# Patient Record
Sex: Male | Born: 1975
Health system: Southern US, Community
[De-identification: ages and names within clinical notes are randomized; demographics above are authoritative.]

## PROBLEM LIST (undated history)

## (undated) DIAGNOSIS — E7800 Pure hypercholesterolemia, unspecified: Secondary | ICD-10-CM

## (undated) DIAGNOSIS — I10 Essential (primary) hypertension: Secondary | ICD-10-CM

---

## 2014-04-06 ENCOUNTER — Other Ambulatory Visit: Payer: Self-pay | Admitting: Family Medicine

## 2014-04-06 ENCOUNTER — Ambulatory Visit
Admission: RE | Admit: 2014-04-06 | Discharge: 2014-04-06 | Disposition: A | Discharge status: Home / Self Care | Payer: 59 | Source: Ambulatory Visit | Attending: Family Medicine | Admitting: Family Medicine

## 2014-04-06 DIAGNOSIS — S8991XA Unspecified injury of right lower leg, initial encounter: Secondary | ICD-10-CM

## 2015-08-17 ENCOUNTER — Encounter (HOSPITAL_COMMUNITY): Payer: Self-pay | Admitting: Emergency Medicine

## 2015-08-17 ENCOUNTER — Emergency Department (HOSPITAL_COMMUNITY)
Admission: EM | Admit: 2015-08-17 | Discharge: 2015-08-17 | Disposition: A | Discharge status: Home / Self Care | Payer: No Typology Code available for payment source | Source: Home / Self Care | Attending: Emergency Medicine | Admitting: Emergency Medicine

## 2015-08-17 DIAGNOSIS — G5603 Carpal tunnel syndrome, bilateral upper limbs: Secondary | ICD-10-CM

## 2015-08-17 LAB — POCT I-STAT, CHEM 8
BUN: 16 mg/dL (ref 6–20)
Calcium, Ion: 1.24 mmol/L — ABNORMAL HIGH (ref 1.12–1.23)
Chloride: 104 mmol/L (ref 101–111)
Creatinine, Ser: 1.2 mg/dL (ref 0.61–1.24)
GLUCOSE: 100 mg/dL — AB (ref 65–99)
HEMATOCRIT: 49 % (ref 39.0–52.0)
HEMOGLOBIN: 16.7 g/dL (ref 13.0–17.0)
POTASSIUM: 3.7 mmol/L (ref 3.5–5.1)
Sodium: 141 mmol/L (ref 135–145)
TCO2: 27 mmol/L (ref 0–100)

## 2015-08-17 MED ORDER — METHYLPREDNISOLONE ACETATE 80 MG/ML IJ SUSP
80.0000 mg | Freq: Once | INTRAMUSCULAR | Status: AC
  Administered 2015-08-17: 80 mg via INTRAMUSCULAR

## 2015-08-17 MED ORDER — MELOXICAM 15 MG PO TABS: 15.0000 mg | ORAL_TABLET | Freq: Every day | ORAL | Status: DC

## 2015-08-17 MED ORDER — METHYLPREDNISOLONE ACETATE 80 MG/ML IJ SUSP
INTRAMUSCULAR | Status: AC
  Filled 2015-08-17: qty 1

## 2015-08-17 NOTE — ED Provider Notes (Signed)
CSN: [id number]     Arrival date & time 08/17/15  1411 History   First MD Initiated Contact with Patient 08/17/15 1552     Chief Complaint  Patient presents with  . Arm Problem   (Consider location/radiation/quality/duration/timing/severity/associated sxs/prior Treatment) HPI He is a 39 year old man here for evaluation of hand numbness. He states for the last 4-5 days he has had spasms and numbness in his bilateral hands. He states all of the fingers are numb. It is primarily the distal part of the fingers. He denies any weakness. His girlfriend told him his hands seem a little swollen. She also told him that his fingers twitch at night. No injury or trauma. 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 He has not tried any medications.  History reviewed. No pertinent past medical history. History reviewed. No pertinent past surgical history. No family history on file. Social History  Substance Use Topics  . Smoking status: Never Smoker   . Smokeless tobacco: None  . Alcohol Use: No    Review of Systems As in history of present illness Allergies  Review of patient's allergies indicates no known allergies.  Home Medications   Prior to Admission medications   Medication Sig Start Date End Date Taking? Authorizing Provider  meloxicam (MOBIC) 15 MG tablet Take 1 tablet (15 mg total) by mouth daily. 08/17/15   Melony Overly, MD   Meds Ordered and Administered this Visit   Medications  methylPREDNISolone acetate (DEPO-MEDROL) injection 80 mg (not administered)    BP 144/92 mmHg  Pulse 71  Temp(Src) 98.6 F (37 C) (Oral)  Resp 16  SpO2 98% No data found.   Physical Exam  Constitutional: He is oriented to person, place, and time. He appears well-developed and well-nourished. No distress.  Cardiovascular: Normal rate.   Pulmonary/Chest: Effort normal.  Musculoskeletal:  Hands: Possible very mild swelling. He has full active range of motion. 5 out of 5 strength in grip  and interosseous. Normal thumb opposition. 2+ radial pulses bilaterally. Brisk cap refill in all digits. Positive Tinel's and Phalen's bilaterally. Sensation to light touch grossly intact.  Neurological: He is alert and oriented to person, place, and time.    ED Course  Procedures (including critical care time)  Labs Review Labs Reviewed  POCT I-STAT, CHEM 8 - Abnormal; Notable for the following:    Glucose, Bld 100 (*)    Calcium, Ion 1.24 (*)    All other components within normal limits    Imaging Review No results found.    MDM   1. Carpal tunnel syndrome, bilateral    Bilateral wrist splints given. Meloxicam daily for the next 2 weeks. Recommended icing, particularly after work. Follow-up with hand specialist if needed.    Melony Overly, MD 08/17/15 [phone]

## 2015-08-17 NOTE — Discharge Instructions (Signed)
Carpal Tunnel Syndrome Carpal tunnel syndrome is a condition that causes pain in your hand and arm. The carpal tunnel is a narrow area that is on the palm side of your wrist. Repeated wrist motion or certain diseases may cause swelling in the tunnel. This swelling can pinch the main nerve in the wrist (median nerve).  HOME CARE If You Have a Splint:  Wear it as told by your doctor. Wear it as much as you can for the next 2 weeks, included nighttime.  Loosen the splint if your fingers:  Become numb and tingle.  Turn blue and cold.  Keep the splint clean and dry. General Instructions  Take over-the-counter and prescription medicines only as told by your doctor.  Take meloxicam daily for 2 weeks.  Rest your wrist from any activity that may be causing your pain. If needed, talk to your employer about changes that can be made in your work, such as getting a wrist pad to use while typing.  Apply ice to the painful area:  Put ice in a plastic bag.  Place a towel between your skin and the bag.  Leave the ice on for 20 minutes, 2-3 times per [place].  Keep all follow-up visits as told by your doctor. This is important.  Do any exercises as told by your doctor, physical therapist, or occupational therapist. GET HELP IF:  You have new symptoms.  Medicine does not help your pain.  Your symptoms get worse.   This information is not intended to replace advice given to you by your health care provider. Make sure you discuss any questions you have with your health care provider.   Document Released: 10/04/2011 Document Revised: 07/06/2015 Document Reviewed: 03/02/2015 Elsevier Interactive Patient Education [employer].

## 2016-02-05 ENCOUNTER — Observation Stay (HOSPITAL_COMMUNITY): Payer: Managed Care, Other (non HMO)

## 2016-02-05 ENCOUNTER — Observation Stay (HOSPITAL_COMMUNITY)
Admission: EM | Admit: 2016-02-05 | Discharge: 2016-02-06 | Disposition: A | Discharge status: Home / Self Care | Payer: Managed Care, Other (non HMO) | Attending: Oncology | Admitting: Oncology

## 2016-02-05 ENCOUNTER — Encounter (HOSPITAL_COMMUNITY): Payer: Self-pay | Admitting: *Deleted

## 2016-02-05 ENCOUNTER — Encounter (HOSPITAL_COMMUNITY): Payer: Self-pay | Admitting: Radiology

## 2016-02-05 ENCOUNTER — Ambulatory Visit (INDEPENDENT_AMBULATORY_CARE_PROVIDER_SITE_OTHER)
Admission: EM | Admit: 2016-02-05 | Discharge: 2016-02-05 | Disposition: A | Discharge status: Home / Self Care | Payer: Managed Care, Other (non HMO) | Source: Home / Self Care | Attending: Family Medicine | Admitting: Family Medicine

## 2016-02-05 ENCOUNTER — Emergency Department (HOSPITAL_COMMUNITY): Payer: Managed Care, Other (non HMO)

## 2016-02-05 DIAGNOSIS — R29818 Other symptoms and signs involving the nervous system: Secondary | ICD-10-CM | POA: Diagnosis not present

## 2016-02-05 DIAGNOSIS — I639 Cerebral infarction, unspecified: Secondary | ICD-10-CM

## 2016-02-05 DIAGNOSIS — E785 Hyperlipidemia, unspecified: Secondary | ICD-10-CM | POA: Diagnosis not present

## 2016-02-05 DIAGNOSIS — R4781 Slurred speech: Secondary | ICD-10-CM | POA: Diagnosis not present

## 2016-02-05 DIAGNOSIS — G5600 Carpal tunnel syndrome, unspecified upper limb: Secondary | ICD-10-CM | POA: Diagnosis not present

## 2016-02-05 DIAGNOSIS — R202 Paresthesia of skin: Secondary | ICD-10-CM | POA: Diagnosis not present

## 2016-02-05 DIAGNOSIS — R471 Dysarthria and anarthria: Secondary | ICD-10-CM | POA: Diagnosis not present

## 2016-02-05 DIAGNOSIS — R200 Anesthesia of skin: Principal | ICD-10-CM

## 2016-02-05 DIAGNOSIS — G459 Transient cerebral ischemic attack, unspecified: Secondary | ICD-10-CM

## 2016-02-05 HISTORY — DX: Pure hypercholesterolemia, unspecified: E78.00

## 2016-02-05 HISTORY — DX: Essential (primary) hypertension: I10

## 2016-02-05 LAB — I-STAT CHEM 8, ED
BUN: 17 mg/dL (ref 6–20)
CALCIUM ION: 1.17 mmol/L (ref 1.12–1.23)
Chloride: 100 mmol/L — ABNORMAL LOW (ref 101–111)
Creatinine, Ser: 1 mg/dL (ref 0.61–1.24)
GLUCOSE: 97 mg/dL (ref 65–99)
HCT: 50 % (ref 39.0–52.0)
Hemoglobin: 17 g/dL (ref 13.0–17.0)
Potassium: 3.4 mmol/L — ABNORMAL LOW (ref 3.5–5.1)
SODIUM: 141 mmol/L (ref 135–145)
TCO2: 27 mmol/L (ref 0–100)

## 2016-02-05 LAB — DIFFERENTIAL
Basophils Absolute: 0 10*3/uL (ref 0.0–0.1)
Basophils Relative: 0 %
EOS PCT: 2 %
Eosinophils Absolute: 0.2 10*3/uL (ref 0.0–0.7)
LYMPHS ABS: 1.9 10*3/uL (ref 0.7–4.0)
LYMPHS PCT: 29 %
MONO ABS: 0.6 10*3/uL (ref 0.1–1.0)
Monocytes Relative: 9 %
NEUTROS ABS: 3.9 10*3/uL (ref 1.7–7.7)
Neutrophils Relative %: 60 %

## 2016-02-05 LAB — COMPREHENSIVE METABOLIC PANEL
ALK PHOS: 57 U/L (ref 38–126)
ALT: 25 U/L (ref 17–63)
ANION GAP: 11 (ref 5–15)
AST: 27 U/L (ref 15–41)
Albumin: 3.8 g/dL (ref 3.5–5.0)
BILIRUBIN TOTAL: 0.9 mg/dL (ref 0.3–1.2)
BUN: 13 mg/dL (ref 6–20)
CALCIUM: 9.5 mg/dL (ref 8.9–10.3)
CO2: 25 mmol/L (ref 22–32)
Chloride: 103 mmol/L (ref 101–111)
Creatinine, Ser: 1.14 mg/dL (ref 0.61–1.24)
GFR calc Af Amer: >60 mL/min (ref >60)
GFR calc non Af Amer: >60 mL/min (ref >60)
GLUCOSE: 97 mg/dL (ref 65–99)
Potassium: 3.5 mmol/L (ref 3.5–5.1)
Sodium: 139 mmol/L (ref 135–145)
TOTAL PROTEIN: 7.8 g/dL (ref 6.5–8.1)

## 2016-02-05 LAB — CBC
HEMATOCRIT: 45.3 % (ref 39.0–52.0)
HEMOGLOBIN: 14.8 g/dL (ref 13.0–17.0)
MCH: 27.5 pg (ref 26.0–34.0)
MCHC: 32.7 g/dL (ref 30.0–36.0)
MCV: 84.2 fL (ref 78.0–100.0)
Platelets: 225 10*3/uL (ref 150–400)
RBC: 5.38 MIL/uL (ref 4.22–5.81)
RDW: 12.6 % (ref 11.5–15.5)
WBC: 6.6 10*3/uL (ref 4.0–10.5)

## 2016-02-05 LAB — RAPID URINE DRUG SCREEN, HOSP PERFORMED
Amphetamines: NOT DETECTED
BARBITURATES: NOT DETECTED
Benzodiazepines: NOT DETECTED
Cocaine: NOT DETECTED
OPIATES: NOT DETECTED
TETRAHYDROCANNABINOL: NOT DETECTED

## 2016-02-05 LAB — ETHANOL: Alcohol, Ethyl (B): <5 mg/dL (ref <5)

## 2016-02-05 LAB — URINALYSIS, ROUTINE W REFLEX MICROSCOPIC
Bilirubin Urine: NEGATIVE
Glucose, UA: NEGATIVE mg/dL
Hgb urine dipstick: NEGATIVE
KETONES UR: NEGATIVE mg/dL
LEUKOCYTES UA: NEGATIVE
NITRITE: NEGATIVE
PROTEIN: NEGATIVE mg/dL
Specific Gravity, Urine: >1.046 — ABNORMAL HIGH (ref 1.005–1.030)
pH: 6.5 (ref 5.0–8.0)

## 2016-02-05 LAB — I-STAT TROPONIN, ED: TROPONIN I, POC: 0 ng/mL (ref 0.00–0.08)

## 2016-02-05 LAB — PROTIME-INR
INR: 0.99 (ref 0.00–1.49)
Prothrombin Time: 13.3 seconds (ref 11.6–15.2)

## 2016-02-05 LAB — APTT: aPTT: 30 seconds (ref 24–37)

## 2016-02-05 MED ORDER — STROKE: EARLY STAGES OF RECOVERY BOOK
Freq: Once | Status: AC
  Administered 2016-02-06
  Filled 2016-02-05: qty 1

## 2016-02-05 MED ORDER — ACETAMINOPHEN 650 MG RE SUPP: 650.0000 mg | RECTAL | Status: DC | PRN

## 2016-02-05 MED ORDER — ACETAMINOPHEN 325 MG PO TABS: 650.0000 mg | ORAL_TABLET | ORAL | Status: DC | PRN

## 2016-02-05 MED ORDER — IOPAMIDOL (ISOVUE-370) INJECTION 76%
INTRAVENOUS | Status: AC
  Filled 2016-02-05: qty 50

## 2016-02-05 MED ORDER — ENOXAPARIN SODIUM 40 MG/0.4ML ~~LOC~~ SOLN
40.0000 mg | SUBCUTANEOUS | Status: DC
  Administered 2016-02-06: 40 mg via SUBCUTANEOUS
  Filled 2016-02-05: qty 0.4

## 2016-02-05 MED ORDER — SENNOSIDES-DOCUSATE SODIUM 8.6-50 MG PO TABS
1.0000 | ORAL_TABLET | Freq: Every evening | ORAL | Status: DC | PRN
  Filled 2016-02-05: qty 1

## 2016-02-05 MED ORDER — POTASSIUM CHLORIDE CRYS ER 10 MEQ PO TBCR
10.0000 meq | EXTENDED_RELEASE_TABLET | Freq: Once | ORAL | Status: AC
Start: 2016-02-05 — End: 2016-02-06
  Administered 2016-02-06: 10 meq via ORAL
  Filled 2016-02-05: qty 1

## 2016-02-05 MED ORDER — ASPIRIN 81 MG PO CHEW
81.0000 mg | CHEWABLE_TABLET | Freq: Every day | ORAL | Status: DC
  Administered 2016-02-05 – 2016-02-06 (×2): 81 mg via ORAL
  Filled 2016-02-05 (×2): qty 1

## 2016-02-05 MED ORDER — IOPAMIDOL (ISOVUE-370) INJECTION 76%
50.0000 mL | Freq: Once | INTRAVENOUS | Status: AC | PRN
  Administered 2016-02-05: 50 mL via INTRAVENOUS

## 2016-02-05 NOTE — ED Notes (Signed)
Patient states he went to bed about 1500 and when he woke up 1830 he started having some bilateral arm weakness. Patient alert and oriented x4 on arrival . Patient moving all extermties. Patient denise chest pain. Patient complains of jaw pain. MD and Neurologist at bedside,

## 2016-02-05 NOTE — ED Provider Notes (Signed)
CSN: [id number]     Arrival date & time 02/05/16  1946 History   First MD Initiated Contact with Patient 02/05/16 2006     Chief Complaint  Patient presents with  . Code Stroke    An emergency department physician performed an initial assessment on this suspected stroke patient at 62. (Consider location/radiation/quality/duration/timing/severity/associated sxs/prior Treatment) HPI Comments: Patient is a 40 year old male presenting from urgent care due to numbness and slurred speech. Patient states that at 3:30 he went to bed to take a nap and when he woke up at 6:30 he had bilateral upper extremity numbness, neck pain and slurred speech. Patient denies ever having symptoms like this. He denies any headache, chest pain or shortness of breath.  He denies any recent infectious symptoms or colds. He does continue to complain of some numbness in the left arm. He denies any swallowing difficulty, aphasia, lower extremity complaints. No visual complaints. No alcohol, drug or tobacco abuse. Patient states he is supposed be on a cholesterol medication but he does not take it.  The history is provided by the patient.    Past Medical History  Diagnosis Date  . Hypertension   . High cholesterol    No past surgical history on file. No family history on file. Social History  Substance Use Topics  . Smoking status: Never Smoker   . Smokeless tobacco: None  . Alcohol Use: No    Review of Systems  All other systems reviewed and are negative.     Allergies  Other  Home Medications   Prior to Admission medications   Medication Sig Start Date End Date Taking? Authorizing Provider  meloxicam (MOBIC) 15 MG tablet Take 1 tablet (15 mg total) by mouth daily. 08/17/15   Melony Overly, MD   BP 125/80 mmHg  Pulse 82  Temp(Src) 98.6 F (37 C)  Resp 13  SpO2 98% Physical Exam  Constitutional: He is oriented to person, place, and time. He appears well-developed and well-nourished. No distress.   HENT:  Head: Normocephalic and atraumatic.  Mouth/Throat: Oropharynx is clear and moist.  Eyes: Conjunctivae and EOM are normal. Pupils are equal, round, and reactive to light.  Neck: Normal range of motion. Neck supple.  Cardiovascular: Normal rate, regular rhythm and intact distal pulses.   No murmur heard. Pulmonary/Chest: Effort normal and breath sounds normal. No respiratory distress. He has no wheezes. He has no rales.  Abdominal: Soft. He exhibits no distension. There is no tenderness. There is no rebound and no guarding.  Musculoskeletal: Normal range of motion. He exhibits no edema or tenderness.  Neurological: He is alert and oriented to person, place, and time. He has normal strength. A sensory deficit is present. No cranial nerve deficit. Coordination normal.  Decreased sensation in the left upper extremity.  Speech is slow and purposeful.  No aphasia or slurred speech  Skin: Skin is warm and dry. No rash noted. No erythema.  Psychiatric: He has a normal mood and affect. His behavior is normal.  Nursing note and vitals reviewed.   ED Course  Procedures (including critical care time) Labs Review Labs Reviewed  I-STAT CHEM 8, ED - Abnormal; Notable for the following:    Potassium 3.4 (*)    Chloride 100 (*)    All other components within normal limits  ETHANOL  PROTIME-INR  APTT  CBC  DIFFERENTIAL  COMPREHENSIVE METABOLIC PANEL  URINE RAPID DRUG SCREEN, HOSP PERFORMED  URINALYSIS, ROUTINE W REFLEX MICROSCOPIC (NOT AT [facility])  I-STAT TROPOININ, ED    Imaging Review Ct Angio Head W/cm &/or Wo Cm  02/05/2016  CLINICAL DATA:  Weakness and numbness of the upper extremities. EXAM: CT ANGIOGRAPHY HEAD AND NECK TECHNIQUE: Multidetector CT imaging of the head and neck was performed using the standard protocol during bolus administration of intravenous contrast. Multiplanar CT image reconstructions and MIPs were obtained to evaluate the vascular anatomy. Carotid stenosis  measurements (when applicable) are obtained utilizing NASCET criteria, using the distal internal carotid diameter as the denominator. CONTRAST:  50 cc Isovue 370 COMPARISON:  Head CT earlier same [place] FINDINGS: CTA NECK Aortic arch: Aortic arch appears normal. No atherosclerotic change, aneurysm or dissection. Branching pattern of the brachiocephalic vessels from the arch is normal. Right carotid system: Common carotid artery widely patent to the bifurcation. Normal appearing bifurcation. Cervical internal carotid artery is normal. Left carotid system: Common carotid artery widely patent to the bifurcation. Carotid bifurcation is normal. Cervical internal carotid artery is normal. Vertebral arteries:Left vertebral artery dominant. Both vertebral artery origins appear normal. Both vertebral arteries normal through the cervical region. Skeleton: Normal Other neck: Normal CTA HEAD Anterior circulation: Both internal carotid arteries widely patent through the siphon regions. The anterior and middle cerebral vessels are normal without proximal stenosis, aneurysm or vascular malformation. Posterior circulation: Both vertebral arteries are patent to the basilar. No basilar stenosis. Posterior circulation branch vessels are normal. Venous sinuses: Patent and normal Anatomic variants: None significant Delayed phase: No abnormal enhancement IMPRESSION: Normal CT angiography of the neck and the head. Electronically Signed   By: Nelson Chimes M.D.   On: 02/05/2016 20:37   Ct Head Wo Contrast  02/05/2016  CLINICAL DATA:  Bilateral upper extremity numbness. EXAM: CT HEAD WITHOUT CONTRAST TECHNIQUE: Contiguous axial images were obtained from the base of the skull through the vertex without intravenous contrast. COMPARISON:  02/05/2016 FINDINGS: There is no evidence of mass effect, midline shift or extra-axial fluid collections. There is no evidence of a space-occupying lesion or intracranial hemorrhage. There is no evidence of a  cortical-based area of acute infarction. The ventricles and sulci are appropriate for the patient's age. The basal cisterns are patent. Visualized portions of the orbits are unremarkable. The visualized portions of the paranasal sinuses and mastoid air cells are unremarkable. The osseous structures are unremarkable. IMPRESSION: No acute intracranial pathology. These results were called by telephone at the time of interpretation on 02/05/2016 at 8:16 pm to Dr. Epifania Gore, who verbally acknowledged these results. Electronically Signed   By: Kathreen Devoid   On: 02/05/2016 20:18   Ct Angio Neck W/cm &/or Wo/cm  02/05/2016  CLINICAL DATA:  Weakness and numbness of the upper extremities. EXAM: CT ANGIOGRAPHY HEAD AND NECK TECHNIQUE: Multidetector CT imaging of the head and neck was performed using the standard protocol during bolus administration of intravenous contrast. Multiplanar CT image reconstructions and MIPs were obtained to evaluate the vascular anatomy. Carotid stenosis measurements (when applicable) are obtained utilizing NASCET criteria, using the distal internal carotid diameter as the denominator. CONTRAST:  50 cc Isovue 370 COMPARISON:  Head CT earlier same [place] FINDINGS: CTA NECK Aortic arch: Aortic arch appears normal. No atherosclerotic change, aneurysm or dissection. Branching pattern of the brachiocephalic vessels from the arch is normal. Right carotid system: Common carotid artery widely patent to the bifurcation. Normal appearing bifurcation. Cervical internal carotid artery is normal. Left carotid system: Common carotid artery widely patent to the bifurcation. Carotid bifurcation is normal. Cervical internal carotid artery is normal. Vertebral  arteries:Left vertebral artery dominant. Both vertebral artery origins appear normal. Both vertebral arteries normal through the cervical region. Skeleton: Normal Other neck: Normal CTA HEAD Anterior circulation: Both internal carotid arteries widely patent  through the siphon regions. The anterior and middle cerebral vessels are normal without proximal stenosis, aneurysm or vascular malformation. Posterior circulation: Both vertebral arteries are patent to the basilar. No basilar stenosis. Posterior circulation branch vessels are normal. Venous sinuses: Patent and normal Anatomic variants: None significant Delayed phase: No abnormal enhancement IMPRESSION: Normal CT angiography of the neck and the head. Electronically Signed   By: Nelson Chimes M.D.   On: 02/05/2016 20:37   I have personally reviewed and evaluated these images and lab results as part of my medical decision-making.   EKG Interpretation   Date/Time:  Sunday February 05 2016 20:26:48 EDT Ventricular Rate:  82 PR Interval:  162 QRS Duration: 117 QT Interval:  363 QTC Calculation: 424 R Axis:   39 Text Interpretation:  Sinus rhythm Nonspecific intraventricular conduction  delay ST elev, probable normal early repol pattern No previous tracing  Confirmed by Maryan Rued  MD, Loree Fee ([id number]) on 02/05/2016 8:37:36 PM      MDM   Final diagnoses:  CVA (cerebral infarction)  CVA (cerebral infarction)    Patient is a 40 year old male with a history of hyperlipidemia in the past and he states he's supposed to be on medication for it may be hypertension presented to urgent care with stroke type symptoms. Patient states that he went to bed at 3:30 for a nap and was normal when he woke up at 6:30 he had bilateral upper extremity numbness and weakness as well as limits. Currently he is only complaining of numbness in the left arm as well as neck and head pain. He has never experienced symptoms like this before. He denies drug or alcohol use. No tobacco abuse. Stroke and cardiac disease run in the family. Patient seen by the stroke team and currently he is not a candidate for TPA. He will be getting a CTA of the head, neck and an MRI.  Dr. Nicole Kindred with neurology does feel that the patient needs to be  admitted for stroke workup. He is currently an NIH of 1 for numbness in the left upper extremity.  EKG with early repolarization but no other acute findings. Labs within normal limits expected for a mild hypokalemic of 3.4. Head CT and CTA of the head and neck are within normal limits. Will admit for further care.    Blanchie Dessert, MD 02/05/16 2057

## 2016-02-05 NOTE — Consult Note (Signed)
Admission H&P    Chief Complaint: Upper extremity numbness and slurred speech.  HPI: Charles Shaffer is an 40 y.o. male with a history of hyperlipidemia as well as possible hypertension with poor compliance with treatment, presenting with new onset numbness involving initially left upper extremity and subsequently involving his right upper extremity, along with slurred speech. Patient was last known well at 3:30 PM. He took a nap and symptoms were present when he woke up at 6:30 PM. He has no previous history of stroke nor TIA. He has not been on antiplatelet therapy daily. CT scan of his head was unremarkable with no acute findings. CT angiogram of head and neck was also unremarkable. Patient's deficits resolved except for mild residual numbness involving his left upper extremity.  LSN: 3:30 PM on 02/05/2016 tPA Given: No: Minimal, largely subjective deficits mRankin:  Past Medical History  Diagnosis Date  . Hypertension   . High cholesterol     No past surgical history on file.  No family history on file. Social History:  reports that he has never smoked. He does not have any smokeless tobacco history on file. He reports that he does not drink alcohol or use illicit drugs.  Allergies:  Allergies  Allergen Reactions  . Cashew Nut Oil Other (See Comments)    Numbness on tongue and mouth    Medications: Patient's preadmission medications were reviewed by me.  ROS: History obtained from the patient  General ROS: negative for - chills, fatigue, fever, night sweats, weight gain or weight loss Psychological ROS: negative for - behavioral disorder, hallucinations, memory difficulties, mood swings or suicidal ideation Ophthalmic ROS: negative for - blurry vision, double vision, eye pain or loss of vision ENT ROS: negative for - epistaxis, nasal discharge, oral lesions, sore throat, tinnitus or vertigo Allergy and Immunology ROS: negative for - hives or itchy/watery eyes Hematological and  Lymphatic ROS: negative for - bleeding problems, bruising or swollen lymph nodes Endocrine ROS: negative for - galactorrhea, hair pattern changes, polydipsia/polyuria or temperature intolerance Respiratory ROS: negative for - cough, hemoptysis, shortness of breath or wheezing Cardiovascular ROS: negative for - chest pain, dyspnea on exertion, edema or irregular heartbeat Gastrointestinal ROS: negative for - abdominal pain, diarrhea, hematemesis, nausea/vomiting or stool incontinence Genito-Urinary ROS: negative for - dysuria, hematuria, incontinence or urinary frequency/urgency Musculoskeletal ROS: negative for - joint swelling or muscular weakness Neurological ROS: as noted in HPI Dermatological ROS: negative for rash and skin lesion changes  Physical Examination: Blood pressure 127/85, pulse 69, temperature 98.6 F (37 C), resp. rate 12, SpO2 98 %.  HEENT-  Normocephalic, no lesions, without obvious abnormality.  Normal external eye and conjunctiva.  Normal TM's bilaterally.  Normal auditory canals and external ears. Normal external nose, mucus membranes and septum.  Normal pharynx. Neck supple with no masses, nodes, nodules or enlargement. Cardiovascular - regular rate and rhythm, S1, S2 normal, no murmur, click, rub or gallop Lungs - chest clear, no wheezing, rales, normal symmetric air entry Abdomen - soft, non-tender; bowel sounds normal; no masses,  no organomegaly Extremities - no joint deformities, effusion, or inflammation and no edema  Neurologic Examination: Mental Status: Alert, oriented, thought content appropriate.  Speech fluent without evidence of aphasia. Able to follow commands without difficulty. Cranial Nerves: II-Visual fields were normal. III/IV/VI-Pupils were equal and reacted. Extraocular movements were full and conjugate.    V/VII-no facial numbness and no facial weakness. VIII-normal. X-normal speech and symmetrical palatal movement. XI: trapezius  strength/neck flexion strength normal  bilaterally XII-midline tongue extension with normal strength. Motor: 5/5 bilaterally with normal tone and bulk Sensory: Reduced perception of tactile sensation involving left upper extremity compared to right upper extremity. Deep Tendon Reflexes: 1+ and symmetric. Plantars: Flexor bilaterally Cerebellar: Normal finger-to-nose testing. Carotid auscultation: Normal  Results for orders placed or performed during the hospital encounter of 02/05/16 (from the past 48 hour(s))  Ethanol     Status: None   Collection Time: 02/05/16  8:20 PM  Result Value Ref Range   Alcohol, Ethyl (B) <5 <5 mg/dL    Comment:        LOWEST DETECTABLE LIMIT FOR SERUM ALCOHOL IS 5 mg/dL FOR MEDICAL PURPOSES ONLY   I-stat troponin, ED (not at [facility], [facility])     Status: None   Collection Time: 02/05/16  8:20 PM  Result Value Ref Range   Troponin i, poc 0.00 0.00 - 0.08 ng/mL   Comment 3            Comment: Due to the release kinetics of cTnI, a negative result within the first hours of the onset of symptoms does not rule out myocardial infarction with certainty. If myocardial infarction is still suspected, repeat the test at appropriate intervals.   Protime-INR     Status: None   Collection Time: 02/05/16  8:21 PM  Result Value Ref Range   Prothrombin Time 13.3 11.6 - 15.2 seconds   INR 0.99 0.00 - 1.49  APTT     Status: None   Collection Time: 02/05/16  8:21 PM  Result Value Ref Range   aPTT 30 24 - 37 seconds  CBC     Status: None   Collection Time: 02/05/16  8:21 PM  Result Value Ref Range   WBC 6.6 4.0 - 10.5 K/uL   RBC 5.38 4.22 - 5.81 MIL/uL   Hemoglobin 14.8 13.0 - 17.0 g/dL   HCT 45.3 39.0 - 52.0 %   MCV 84.2 78.0 - 100.0 fL   MCH 27.5 26.0 - 34.0 pg   MCHC 32.7 30.0 - 36.0 g/dL   RDW 12.6 11.5 - 15.5 %   Platelets 225 150 - 400 K/uL  Differential     Status: None   Collection Time: 02/05/16  8:21 PM  Result Value Ref Range   Neutrophils Relative %  60 %   Neutro Abs 3.9 1.7 - 7.7 K/uL   Lymphocytes Relative 29 %   Lymphs Abs 1.9 0.7 - 4.0 K/uL   Monocytes Relative 9 %   Monocytes Absolute 0.6 0.1 - 1.0 K/uL   Eosinophils Relative 2 %   Eosinophils Absolute 0.2 0.0 - 0.7 K/uL   Basophils Relative 0 %   Basophils Absolute 0.0 0.0 - 0.1 K/uL  Comprehensive metabolic panel     Status: None   Collection Time: 02/05/16  8:21 PM  Result Value Ref Range   Sodium 139 135 - 145 mmol/L   Potassium 3.5 3.5 - 5.1 mmol/L   Chloride 103 101 - 111 mmol/L   CO2 25 22 - 32 mmol/L   Glucose, Bld 97 65 - 99 mg/dL   BUN 13 6 - 20 mg/dL   Creatinine, Ser 1.14 0.61 - 1.24 mg/dL   Calcium 9.5 8.9 - 10.3 mg/dL   Total Protein 7.8 6.5 - 8.1 g/dL   Albumin 3.8 3.5 - 5.0 g/dL   AST 27 15 - 41 U/L   ALT 25 17 - 63 U/L   Alkaline Phosphatase 57 38 - 126 U/L  Total Bilirubin 0.9 0.3 - 1.2 mg/dL   GFR calc non Af Amer >60 >60 mL/min   GFR calc Af Amer >60 >60 mL/min    Comment: (NOTE) The eGFR has been calculated using the CKD EPI equation. This calculation has not been validated in all clinical situations. eGFR's persistently <60 mL/min signify possible Chronic Kidney Disease.    Anion gap 11 5 - 15  I-Stat Chem 8, ED  (not at [facility], [facility])     Status: Abnormal   Collection Time: 02/05/16  8:22 PM  Result Value Ref Range   Sodium 141 135 - 145 mmol/L   Potassium 3.4 (L) 3.5 - 5.1 mmol/L   Chloride 100 (L) 101 - 111 mmol/L   BUN 17 6 - 20 mg/dL   Creatinine, Ser 1.00 0.61 - 1.24 mg/dL   Glucose, Bld 97 65 - 99 mg/dL   Calcium, Ion 1.17 1.12 - 1.23 mmol/L   TCO2 27 0 - 100 mmol/L   Hemoglobin 17.0 13.0 - 17.0 g/dL   HCT 50.0 39.0 - 52.0 %   Ct Angio Head W/cm &/or Wo Cm  02/05/2016  CLINICAL DATA:  Weakness and numbness of the upper extremities. EXAM: CT ANGIOGRAPHY HEAD AND NECK TECHNIQUE: Multidetector CT imaging of the head and neck was performed using the standard protocol during bolus administration of intravenous contrast. Multiplanar CT  image reconstructions and MIPs were obtained to evaluate the vascular anatomy. Carotid stenosis measurements (when applicable) are obtained utilizing NASCET criteria, using the distal internal carotid diameter as the denominator. CONTRAST:  50 cc Isovue 370 COMPARISON:  Head CT earlier same [place] FINDINGS: CTA NECK Aortic arch: Aortic arch appears normal. No atherosclerotic change, aneurysm or dissection. Branching pattern of the brachiocephalic vessels from the arch is normal. Right carotid system: Common carotid artery widely patent to the bifurcation. Normal appearing bifurcation. Cervical internal carotid artery is normal. Left carotid system: Common carotid artery widely patent to the bifurcation. Carotid bifurcation is normal. Cervical internal carotid artery is normal. Vertebral arteries:Left vertebral artery dominant. Both vertebral artery origins appear normal. Both vertebral arteries normal through the cervical region. Skeleton: Normal Other neck: Normal CTA HEAD Anterior circulation: Both internal carotid arteries widely patent through the siphon regions. The anterior and middle cerebral vessels are normal without proximal stenosis, aneurysm or vascular malformation. Posterior circulation: Both vertebral arteries are patent to the basilar. No basilar stenosis. Posterior circulation branch vessels are normal. Venous sinuses: Patent and normal Anatomic variants: None significant Delayed phase: No abnormal enhancement IMPRESSION: Normal CT angiography of the neck and the head. Electronically Signed   By: Nelson Chimes M.D.   On: 02/05/2016 20:37   Ct Head Wo Contrast  02/05/2016  CLINICAL DATA:  Bilateral upper extremity numbness. EXAM: CT HEAD WITHOUT CONTRAST TECHNIQUE: Contiguous axial images were obtained from the base of the skull through the vertex without intravenous contrast. COMPARISON:  02/05/2016 FINDINGS: There is no evidence of mass effect, midline shift or extra-axial fluid collections. There is  no evidence of a space-occupying lesion or intracranial hemorrhage. There is no evidence of a cortical-based area of acute infarction. The ventricles and sulci are appropriate for the patient's age. The basal cisterns are patent. Visualized portions of the orbits are unremarkable. The visualized portions of the paranasal sinuses and mastoid air cells are unremarkable. The osseous structures are unremarkable. IMPRESSION: No acute intracranial pathology. These results were called by telephone at the time of interpretation on 02/05/2016 at 8:16 pm to Dr. Epifania Gore, who verbally  acknowledged these results. Electronically Signed   By: Kathreen Devoid   On: 02/05/2016 20:18   Ct Angio Neck W/cm &/or Wo/cm  02/05/2016  CLINICAL DATA:  Weakness and numbness of the upper extremities. EXAM: CT ANGIOGRAPHY HEAD AND NECK TECHNIQUE: Multidetector CT imaging of the head and neck was performed using the standard protocol during bolus administration of intravenous contrast. Multiplanar CT image reconstructions and MIPs were obtained to evaluate the vascular anatomy. Carotid stenosis measurements (when applicable) are obtained utilizing NASCET criteria, using the distal internal carotid diameter as the denominator. CONTRAST:  50 cc Isovue 370 COMPARISON:  Head CT earlier same [place] FINDINGS: CTA NECK Aortic arch: Aortic arch appears normal. No atherosclerotic change, aneurysm or dissection. Branching pattern of the brachiocephalic vessels from the arch is normal. Right carotid system: Common carotid artery widely patent to the bifurcation. Normal appearing bifurcation. Cervical internal carotid artery is normal. Left carotid system: Common carotid artery widely patent to the bifurcation. Carotid bifurcation is normal. Cervical internal carotid artery is normal. Vertebral arteries:Left vertebral artery dominant. Both vertebral artery origins appear normal. Both vertebral arteries normal through the cervical region. Skeleton: Normal Other  neck: Normal CTA HEAD Anterior circulation: Both internal carotid arteries widely patent through the siphon regions. The anterior and middle cerebral vessels are normal without proximal stenosis, aneurysm or vascular malformation. Posterior circulation: Both vertebral arteries are patent to the basilar. No basilar stenosis. Posterior circulation branch vessels are normal. Venous sinuses: Patent and normal Anatomic variants: None significant Delayed phase: No abnormal enhancement IMPRESSION: Normal CT angiography of the neck and the head. Electronically Signed   By: Nelson Chimes M.D.   On: 02/05/2016 20:37    Assessment: 40 y.o. male with hyperlipidemia and equivocal history of hypertension presenting with possible TIA. Small vessel out, as well.  Stroke Risk Factors - hyperlipidemia  Plan: 1. HgbA1c, fasting lipid panel 2. MRI, MRA  of the brain without contrast 3. PT consult, OT consult, Speech consult 4. Echocardiogram 5. Carotid dopplers 6. Prophylactic therapy-Antiplatelet med: Aspirin  7. Risk factor modification 8. Telemetry monitoring 9. Hypercoagulopathy panel  C.R. Nicole Kindred, MD [facility] [phone]  02/05/2016, 9:27 PM

## 2016-02-05 NOTE — ED Notes (Signed)
Per pt: woke up from nap approx 30 min ago with BUE numbness and slight pain in back of neck along with slurred speech.  In talking with pt, speech is not slurred, but is quiet and barely audible.  Pt keeping eyes closed while talking.  Bilat hand grasps equal and strong, though slow to get to full strength.  Able to maintain raised arms equally.  Per father: states pt called him reporting BUE numbness, sweating, and was talking with slurred speech.  Father states pt ambulating without any difficulty.  Upon returning to triage rm, pt's eyes opened looking at phone.

## 2016-02-05 NOTE — ED Notes (Signed)
Patient still in MRI.  

## 2016-02-05 NOTE — Progress Notes (Signed)
E.R called  After patient assigined to Probation officer by charge and obtained report. Informed that patient currently in MRI

## 2016-02-05 NOTE — ED Notes (Signed)
Report called to Angela Nevin, ED Triage RN.

## 2016-02-05 NOTE — H&P (Signed)
Date: 02/05/2016               Patient Name:  Charles Shaffer MRN: [id number]  DOB: [birth year] Age / Sex: 40 y.o., male   PCP: No Pcp Per Patient         Medical Service: Internal Medicine Teaching Service         Attending Physician: Dr. Annia Belt, MD    First Contact: Dr. Loleta Chance Pager: [phone]  Second Contact: Dr. Charlott Rakes Pager: [phone]       After Hours (After 5p/  First Contact Pager: [phone]  weekends / holidays): Second Contact Pager: [phone]   Chief Complaint: Bilateral upper extremity numbness and slurred speech  History of Present Illness: Charles Shaffer is a 40yo with carpal tunnel syndrome, hyperlipidemia and possible HTN who presents with bilateral upper extremity numbness and slurred speech since 6:30pm today. He felt his normal self when taking a nap at 3:30pm, but woke up at 6:30pm with numbness in both his arms and going across his chest, as well as slurred speech. Both of these symptoms have gradually improved, with some residual LUE sx but are now resolved. He has never had these symptoms before. He says he experiences hand weakness with his carpal tunnel, and these current symptoms are totally different. He does say he had a recent URI ~2 weeks ago. He also noticed a pruritic rash on his left back over the past week, as well as a 'cyst' on his right shoulder, and a little muscle tightness on the left side of his posterior neck. Otherwise, he denies trauma, fevers, headache, vision changes, lightheadedness, vertigo/tinnitus, facial droop, any pain, any weakness, chest pain, shortness of breath, N/V/D, urinary symptoms, sick contacts, tick bites, recent camping adventures or other travel. He is a never smoker, denies EtOH abuse, or any recreational/IVDU.  Meds: Current Facility-Administered Medications  Medication Dose Route Frequency Provider Last Rate Last Dose  .  stroke: mapping our early stages of recovery book   Does not apply Once Milagros Loll, MD        . acetaminophen (TYLENOL) tablet 650 mg  650 mg Oral Q4H PRN Milagros Loll, MD       Or  . acetaminophen (TYLENOL) suppository 650 mg  650 mg Rectal Q4H PRN Milagros Loll, MD      . aspirin chewable tablet 81 mg  81 mg Oral Daily Milagros Loll, MD   81 mg at 02/05/16 2210  . senna-docusate (Senokot-S) tablet 1 tablet  1 tablet Oral QHS PRN Milagros Loll, MD       Current Outpatient Prescriptions  Medication Sig Dispense Refill  . acetaminophen (TYLENOL) 500 MG tablet Take 1,000 mg by mouth every 6 (six) hours as needed (pain).    . Cholecalciferol (VITAMIN D PO) Take 1 tablet by mouth daily.    . Cyanocobalamin (VITAMIN B-12 PO) Take 1 tablet by mouth daily.    . Multiple Vitamins-Minerals (ZINC PO) Take 1 tablet by mouth daily.    . Omega-3 Fatty Acids (FISH OIL PO) Take 1 capsule by mouth daily.    . SELENIUM PO Take 1 tablet by mouth daily.    . meloxicam (MOBIC) 15 MG tablet Take 1 tablet (15 mg total) by mouth daily. (Patient not taking: Reported on 02/05/2016) 30 tablet 1    Allergies: Allergies as of 02/05/2016 - Review Complete 02/05/2016  Allergen Reaction Noted  . Cashew nut oil Other (See Comments) 02/05/2016  Past Medical History  Diagnosis Date  . Hypertension   . High cholesterol    No past surgical history on file. Family History  Problem Relation Age of Onset  . Stroke Brother 37  . Diabetes Father 65  . CAD Father 85   Social History   Social History  . Marital Status: Married    Spouse Name: N/A  . Number of Children: N/A  . Years of Education: N/A   Occupational History  . Not on file.   Social History Main Topics  . Smoking status: Never Smoker   . Smokeless tobacco: Never Used  . Alcohol Use: No  . Drug Use: No  . Sexual Activity: Not on file   Other Topics Concern  . Not on file   Social History Narrative   Review of Systems: Pertinent items noted in HPI and remainder of comprehensive ROS otherwise negative.  Physical  Exam: Blood pressure 117/76, pulse 67, temperature 98.6 F (37 C), resp. rate 12, SpO2 98 %.   Gen: Well-appearing, alert and oriented to person, place, and time HEENT: Oropharynx clear without erythema or exudate.  Neck: No cervical LAD, no thyromegaly or nodules, no JVD noted. No carotid bruits heard. CV: Normal rate, regular rhythm, no murmurs, rubs, or gallops Pulmonary: Normal effort, CTA bilaterally, no crackles or wheezes Abdominal: Soft, non-tender, non-distended, without rebound, guarding, or masses Extremities: Distal pulses 2+ in upper and lower extremities bilaterally, no tenderness, erythema or edema Neuro: CN II-XII grossly intact, 5/5 strength in upper and lower extremities symmetrically. No decrease in fine touch in bilateral hands or rest of upper/lower extremities. No finger-to-nose or heel-to-shin dysmetria. No dysdiadochokinesia. Spurling's test negative bilaterally. Lhermitte's sign negative. No vertebral point tenderness. Normal ROM in upper extremities bilaterally.  Skin: No atypical appearing moles. There is a small area of confluent erythema, non-vesicular, non-morbiliform over the left flank. Small ecchymosis over the right posterior shoulder.   Lab results: Basic Metabolic Panel:  Recent Labs  02/05/16 2021 02/05/16 2022  NA 139 141  K 3.5 3.4*  CL 103 100*  CO2 25  --   GLUCOSE 97 97  BUN 13 17  CREATININE 1.14 1.00  CALCIUM 9.5  --    Liver Function Tests:  Recent Labs  02/05/16 2021  AST 27  ALT 25  ALKPHOS 57  BILITOT 0.9  PROT 7.8  ALBUMIN 3.8   CBC:  Recent Labs  02/05/16 2021 02/05/16 2022  WBC 6.6  --   NEUTROABS 3.9  --   HGB 14.8 17.0  HCT 45.3 50.0  MCV 84.2  --   PLT 225  --    Coagulation:  Recent Labs  02/05/16 2021  LABPROT 13.3  INR 0.99   Imaging results:  Ct Angio Head W/cm &/or Wo Cm  02/05/2016  CLINICAL DATA:  Weakness and numbness of the upper extremities. EXAM: CT ANGIOGRAPHY HEAD AND NECK TECHNIQUE:  Multidetector CT imaging of the head and neck was performed using the standard protocol during bolus administration of intravenous contrast. Multiplanar CT image reconstructions and MIPs were obtained to evaluate the vascular anatomy. Carotid stenosis measurements (when applicable) are obtained utilizing NASCET criteria, using the distal internal carotid diameter as the denominator. CONTRAST:  50 cc Isovue 370 COMPARISON:  Head CT earlier same [place] FINDINGS: CTA NECK Aortic arch: Aortic arch appears normal. No atherosclerotic change, aneurysm or dissection. Branching pattern of the brachiocephalic vessels from the arch is normal. Right carotid system: Common carotid artery widely patent to  the bifurcation. Normal appearing bifurcation. Cervical internal carotid artery is normal. Left carotid system: Common carotid artery widely patent to the bifurcation. Carotid bifurcation is normal. Cervical internal carotid artery is normal. Vertebral arteries:Left vertebral artery dominant. Both vertebral artery origins appear normal. Both vertebral arteries normal through the cervical region. Skeleton: Normal Other neck: Normal CTA HEAD Anterior circulation: Both internal carotid arteries widely patent through the siphon regions. The anterior and middle cerebral vessels are normal without proximal stenosis, aneurysm or vascular malformation. Posterior circulation: Both vertebral arteries are patent to the basilar. No basilar stenosis. Posterior circulation branch vessels are normal. Venous sinuses: Patent and normal Anatomic variants: None significant Delayed phase: No abnormal enhancement IMPRESSION: Normal CT angiography of the neck and the head. Electronically Signed   By: Nelson Chimes M.D.   On: 02/05/2016 20:37   Ct Head Wo Contrast  02/05/2016  CLINICAL DATA:  Bilateral upper extremity numbness. EXAM: CT HEAD WITHOUT CONTRAST TECHNIQUE: Contiguous axial images were obtained from the base of the skull through the vertex  without intravenous contrast. COMPARISON:  02/05/2016 FINDINGS: There is no evidence of mass effect, midline shift or extra-axial fluid collections. There is no evidence of a space-occupying lesion or intracranial hemorrhage. There is no evidence of a cortical-based area of acute infarction. The ventricles and sulci are appropriate for the patient's age. The basal cisterns are patent. Visualized portions of the orbits are unremarkable. The visualized portions of the paranasal sinuses and mastoid air cells are unremarkable. The osseous structures are unremarkable. IMPRESSION: No acute intracranial pathology. These results were called by telephone at the time of interpretation on 02/05/2016 at 8:16 pm to Dr. Epifania Gore, who verbally acknowledged these results. Electronically Signed   By: Kathreen Devoid   On: 02/05/2016 20:18   Ct Angio Neck W/cm &/or Wo/cm  02/05/2016  CLINICAL DATA:  Weakness and numbness of the upper extremities. EXAM: CT ANGIOGRAPHY HEAD AND NECK TECHNIQUE: Multidetector CT imaging of the head and neck was performed using the standard protocol during bolus administration of intravenous contrast. Multiplanar CT image reconstructions and MIPs were obtained to evaluate the vascular anatomy. Carotid stenosis measurements (when applicable) are obtained utilizing NASCET criteria, using the distal internal carotid diameter as the denominator. CONTRAST:  50 cc Isovue 370 COMPARISON:  Head CT earlier same [place] FINDINGS: CTA NECK Aortic arch: Aortic arch appears normal. No atherosclerotic change, aneurysm or dissection. Branching pattern of the brachiocephalic vessels from the arch is normal. Right carotid system: Common carotid artery widely patent to the bifurcation. Normal appearing bifurcation. Cervical internal carotid artery is normal. Left carotid system: Common carotid artery widely patent to the bifurcation. Carotid bifurcation is normal. Cervical internal carotid artery is normal. Vertebral  arteries:Left vertebral artery dominant. Both vertebral artery origins appear normal. Both vertebral arteries normal through the cervical region. Skeleton: Normal Other neck: Normal CTA HEAD Anterior circulation: Both internal carotid arteries widely patent through the siphon regions. The anterior and middle cerebral vessels are normal without proximal stenosis, aneurysm or vascular malformation. Posterior circulation: Both vertebral arteries are patent to the basilar. No basilar stenosis. Posterior circulation branch vessels are normal. Venous sinuses: Patent and normal Anatomic variants: None significant Delayed phase: No abnormal enhancement IMPRESSION: Normal CT angiography of the neck and the head. Electronically Signed   By: Nelson Chimes M.D.   On: 02/05/2016 20:37   Other results: EKG: normal EKG, normal sinus rhythm  Assessment & Plan by Problem: 1. Transient bilateral upper extremity numbness and slurred speech -  40yo with transient symptoms, with h/o HLD, possibly HTN (although normotensive on no meds here). So far, CT with angiography has been normal. He is at risk for a CVA/TIA given these factors, which is still our working diagnosis, but the bilateral upper extremity numbness and questionable slurred speech are an irregular distribution for a CVA and could represent a cervical spinal cord process as well, simply compressive/mechanical or, less likely, an autoimmune process such as MS. However, these are less likely given his lack of trauma, negative Spurling's and Lhermitte's bilaterally. He does have a vague history of viral URI, but a polyneuropathy related to that would more likely be a progressive lower extremity presentation. He has a pruritic rash that looks like an abrasion that erupted 1 week ago, which does not appear viral/VZV in nature and his numbness would present unilaterally in that case. He has no associated symptoms to suggest a complex migraine. Finally, his symptoms are not the  same quality or distribution as his carpal tunnel syndrome, so I do not suspect this is causing his symptoms. -Neurology following; appreciate the thoughtful care of this mutual patient -Stroke/TIA workup as above; MRI/MRA, lipid panel, A1c. CTA of head and neck normal so unsure if carotid dopplers necessary. Could consider an EMG if the stroke workup comes back unrevealing. -Anti-phospholipid, B2 microglobulin, cardiolipin -Repeat EKG in AM -Telemetry -Risk factor modification - consider ASA, statin  Dispo: Disposition is deferred at this time, awaiting improvement of current medical problems. Anticipated discharge in approximately 1-2 Lehner(s).   The patient does not have a current PCP (No Pcp Per Patient) and does need an [facility] hospital follow-up appointment after discharge.  The patient does not have transportation limitations that hinder transportation to clinic appointments.  Signed: Norval Gable, MD 02/05/2016, 10:30 PM

## 2016-02-05 NOTE — Code Documentation (Signed)
Charles Shaffer is a 39yo bm presenting to [facility] via pvt vehicle after waking up with Lt neck and head pain and numbness & tingling of his BUE.  NIH 1 for sensory impairment.  He states he is feeling better and the HA & tingling has improved though he states he still is feeling "not right".  CTA completed.

## 2016-02-05 NOTE — ED Provider Notes (Signed)
CSN: [id number]     Arrival date & time 02/05/16  1847 History   First MD Initiated Contact with Patient 02/05/16 1907     Chief Complaint  Patient presents with  . Numbness   (Consider location/radiation/quality/duration/timing/severity/associated sxs/prior Treatment) Patient is a 40 y.o. male presenting with neurologic complaint. The history is provided by the patient and a parent.  Neurologic Problem This is a new problem. The current episode started 1 to 2 hours ago (awoke from nap with bilat UE numb, tingling without weakness, no lower ext or gait problems). The problem has been gradually improving. Pertinent negatives include no chest pain, no abdominal pain and no headaches.    History reviewed. No pertinent past medical history. History reviewed. No pertinent past surgical history. No family history on file. Social History  Substance Use Topics  . Smoking status: Never Smoker   . Smokeless tobacco: None  . Alcohol Use: No    Review of Systems  Constitutional: Negative.   HENT: Negative.   Cardiovascular: Negative for chest pain.  Gastrointestinal: Negative for abdominal pain.  Neurological: Positive for speech difficulty and numbness. Negative for dizziness, facial asymmetry, weakness and headaches.  All other systems reviewed and are negative.   Allergies  Other  Home Medications   Prior to Admission medications   Medication Sig Start Date End Date Taking? Authorizing Provider  meloxicam (MOBIC) 15 MG tablet Take 1 tablet (15 mg total) by mouth daily. 08/17/15   Melony Overly, MD   Meds Ordered and Administered this Visit  Medications - No data to display  BP 139/88 mmHg  Pulse 87  Resp 16  SpO2 100% No data found.   Physical Exam  Constitutional: He is oriented to person, place, and time. He appears well-developed and well-nourished. He appears lethargic. No distress.  Eyes: Conjunctivae and EOM are normal. Pupils are equal, round, and reactive to light.   Neck: Normal range of motion. Neck supple.  Cardiovascular: Normal rate, regular rhythm, normal heart sounds and intact distal pulses.   Musculoskeletal: Normal range of motion. He exhibits no tenderness.  Lymphadenopathy:    He has no cervical adenopathy.  Neurological: He is oriented to person, place, and time. He has normal strength. He appears lethargic. He displays normal reflexes. No cranial nerve deficit or sensory deficit. Coordination and gait normal. GCS eye subscore is 4. GCS verbal subscore is 5. GCS motor subscore is 6.  Skin: Skin is warm and dry.  Nursing note and vitals reviewed.   ED Course  Procedures (including critical care time)  Labs Review Labs Reviewed - No data to display  Imaging Review No results found.   Visual Acuity Review  Right Eye Distance:   Left Eye Distance:   Bilateral Distance:    Right Eye Near:   Left Eye Near:    Bilateral Near:         MDM   1. Neurologic abnormality    Sent for eval of neuro disorder, ? etiol.    Billy Fischer, MD 02/05/16 [phone]

## 2016-02-05 NOTE — ED Notes (Signed)
CODE STROKE ACTIVATED @ 20:00 PM

## 2016-02-05 NOTE — ED Notes (Signed)
Attempted report 

## 2016-02-05 NOTE — ED Notes (Signed)
Patient provided urinal to provide urine sample.

## 2016-02-05 NOTE — Progress Notes (Signed)
Patient admitted to 5M11. Patient alert and oriented to unit and room tele applied and verified X2

## 2016-02-05 NOTE — ED Notes (Signed)
Called MRI to advised patient has bed and to call when ready to transport.

## 2016-02-06 ENCOUNTER — Encounter (HOSPITAL_COMMUNITY): Payer: Self-pay | Admitting: *Deleted

## 2016-02-06 ENCOUNTER — Telehealth: Payer: Self-pay | Admitting: Internal Medicine

## 2016-02-06 ENCOUNTER — Observation Stay (HOSPITAL_BASED_OUTPATIENT_CLINIC_OR_DEPARTMENT_OTHER): Payer: Managed Care, Other (non HMO)

## 2016-02-06 DIAGNOSIS — G459 Transient cerebral ischemic attack, unspecified: Secondary | ICD-10-CM | POA: Diagnosis not present

## 2016-02-06 DIAGNOSIS — R200 Anesthesia of skin: Principal | ICD-10-CM

## 2016-02-06 DIAGNOSIS — E785 Hyperlipidemia, unspecified: Secondary | ICD-10-CM | POA: Diagnosis not present

## 2016-02-06 DIAGNOSIS — R208 Other disturbances of skin sensation: Secondary | ICD-10-CM | POA: Diagnosis not present

## 2016-02-06 LAB — BASIC METABOLIC PANEL
Anion gap: 10 (ref 5–15)
BUN: 13 mg/dL (ref 6–20)
CALCIUM: 9.2 mg/dL (ref 8.9–10.3)
CHLORIDE: 101 mmol/L (ref 101–111)
CO2: 28 mmol/L (ref 22–32)
CREATININE: 1.1 mg/dL (ref 0.61–1.24)
GFR calc Af Amer: >60 mL/min (ref >60)
GFR calc non Af Amer: >60 mL/min (ref >60)
Glucose, Bld: 108 mg/dL — ABNORMAL HIGH (ref 65–99)
Potassium: 3.4 mmol/L — ABNORMAL LOW (ref 3.5–5.1)
SODIUM: 139 mmol/L (ref 135–145)

## 2016-02-06 LAB — LIPID PANEL
CHOL/HDL RATIO: 5.5 ratio
Cholesterol: 220 mg/dL — ABNORMAL HIGH (ref 0–200)
HDL: 40 mg/dL — AB (ref >40)
LDL CALC: 141 mg/dL — AB (ref 0–99)
Triglycerides: 194 mg/dL — ABNORMAL HIGH (ref <150)
VLDL: 39 mg/dL (ref 0–40)

## 2016-02-06 LAB — ECHOCARDIOGRAM COMPLETE
HEIGHTINCHES: 67 in
WEIGHTICAEL: 2872 [oz_av]

## 2016-02-06 LAB — HIV ANTIBODY (ROUTINE TESTING W REFLEX): HIV Screen 4th Generation wRfx: NONREACTIVE

## 2016-02-06 LAB — MAGNESIUM: Magnesium: 2 mg/dL (ref 1.7–2.4)

## 2016-02-06 MED ORDER — ASPIRIN 81 MG PO CHEW: 81.0000 mg | CHEWABLE_TABLET | Freq: Every day | ORAL | Status: DC

## 2016-02-06 MED ORDER — ATORVASTATIN CALCIUM 40 MG PO TABS: 40.0000 mg | ORAL_TABLET | Freq: Every day | ORAL | Status: DC

## 2016-02-06 MED ORDER — TRIAMCINOLONE ACETONIDE 0.1 % EX OINT: TOPICAL_OINTMENT | Freq: Two times a day (BID) | CUTANEOUS | Status: DC

## 2016-02-06 MED ORDER — TRIAMCINOLONE ACETONIDE 0.1 % EX OINT
TOPICAL_OINTMENT | Freq: Two times a day (BID) | CUTANEOUS | Status: DC
  Filled 2016-02-06: qty 15

## 2016-02-06 NOTE — Evaluation (Signed)
Physical Therapy Evaluation and Discharge Patient Details Name: Charles Shaffer MRN: [id number] DOB: Dec 28, 1975 Today's Date: 02/06/2016   History of Present Illness  Presented with bil UE numbness and slurred speech; MRI brain negative PMHx-hyperlipidenmia, HTN  Clinical Impression  Patient evaluated by Physical Therapy with no further acute PT needs identified. PT is signing off. Thank you for this referral.     Follow Up Recommendations No PT follow up    Equipment Recommendations  None recommended by PT    Recommendations for Other Services       Precautions / Restrictions Precautions Precautions: None      Mobility  Bed Mobility Overal bed mobility: Independent                Transfers Overall transfer level: Independent                  Ambulation/Gait Ambulation/Gait assistance: Independent Ambulation Distance (Feet): 200 Feet Assistive device: None Gait Pattern/deviations: WFL(Within Functional Limits) (slightly antalgic due to rt heel pain)   Gait velocity interpretation: at or above normal speed for age/gender    Stairs Stairs: Yes Stairs assistance: Independent Stair Management: No rails;Forwards;Alternating pattern Number of Stairs: 5 General stair comments: also able to step-touch alternately with each foot x 8 (per Merrilee Jansky)  Wheelchair Mobility    Modified Rankin (Stroke Patients Only) Modified Rankin (Stroke Patients Only) Pre-Morbid Rankin Score: No symptoms Modified Rankin: No symptoms     Balance Overall balance assessment: Independent                               Standardized Balance Assessment Standardized Balance Assessment : Dynamic Gait Index   Dynamic Gait Index Level Surface: Normal Change in Gait Speed: Normal Gait with Horizontal Head Turns: Normal Gait with Vertical Head Turns: Normal Gait and Pivot Turn: Normal Step Over Obstacle: Normal Step Around Obstacles: Normal Steps: Normal Total Score:  24       Pertinent Vitals/Pain Pain Assessment: Faces Faces Pain Scale: Hurts little more Pain Location: Rt heel Pain Descriptors / Indicators: Guarding;Sharp Pain Intervention(s): Limited activity within patient's tolerance    Home Living Family/patient expects to be discharged to:: Private residence Living Arrangements: Alone   Type of Home: House Home Access: Stairs to enter   Technical brewer of Steps: 1 Home Layout: One level Home Equipment: None      Prior Function Level of Independence: Independent         Comments: works as a Nurse, adult in Education officer, community        Extremity/Trunk Assessment   Upper Extremity Assessment: Overall WFL for tasks assessed           Lower Extremity Assessment: Overall WFL for tasks assessed      Cervical / Trunk Assessment: Normal  Communication   Communication: No difficulties  Cognition Arousal/Alertness: Awake/alert Behavior During Therapy: WFL for tasks assessed/performed Overall Cognitive Status: Within Functional Limits for tasks assessed                      General Comments      Exercises        Assessment/Plan    PT Assessment Patent does not need any further PT services  PT Diagnosis Abnormality of gait   PT Problem List    PT Treatment Interventions     PT Goals (Current goals can be found in  the Care Plan section) Acute Rehab PT Goals PT Goal Formulation: All assessment and education complete, DC therapy    Frequency     Barriers to discharge        Co-evaluation               End of Session   Activity Tolerance: Patient tolerated treatment well Patient left: Other (comment) (standing with Dr. Erlinda Hong and NP) Nurse Communication: Mobility status (signed off sheet to be up independently)    Functional Assessment Tool Used: clinical judgement Functional Limitation: Mobility: Walking and moving around Mobility: Walking and Moving Around Current  Status [phone]): 0 percent impaired, limited or restricted Mobility: Walking and Moving Around Goal Status [phone]): 0 percent impaired, limited or restricted Mobility: Walking and Moving Around Discharge Status [phone]): 0 percent impaired, limited or restricted    Time: [id number] PT Time Calculation (min) (ACUTE ONLY): 10 min   Charges:   PT Evaluation $PT Eval Low Complexity: 1 Procedure     PT G Codes:   PT G-Codes **NOT FOR INPATIENT CLASS** Functional Assessment Tool Used: clinical judgement Functional Limitation: Mobility: Walking and moving around Mobility: Walking and Moving Around Current Status [id number]): 0 percent impaired, limited or restricted Mobility: Walking and Moving Around Goal Status [id number]): 0 percent impaired, limited or restricted Mobility: Walking and Moving Around Discharge Status [id number]): 0 percent impaired, limited or restricted    Lukasz Rogus 02/06/2016, 11:30 AM Pager [phone]

## 2016-02-06 NOTE — Telephone Encounter (Signed)
Pt needs TOC scheduled for 02/27/16 with dr Melburn Hake

## 2016-02-06 NOTE — Progress Notes (Signed)
PT Cancellation Note  Patient Details Name: Charles Shaffer MRN: [id number] DOB: 11/29/1975   Cancelled Treatment:    Reason Eval/Treat Not Completed: Patient at procedure or test/unavailable. Pt off unit for echo. PT will see today as able   [name] 02/06/2016, 9:03 AM Pager [phone]

## 2016-02-06 NOTE — Progress Notes (Signed)
STROKE TEAM PROGRESS NOTE   HISTORY OF PRESENT ILLNESS Charles Shaffer is an 40 y.o. male with a history of hyperlipidemia as well as possible hypertension with poor compliance with treatment, presenting with new onset numbness involving initially left upper extremity and subsequently involving his right upper extremity, along with slurred speech. Patient was last known well at 3:30 PM on 02/05/2016. He took a nap and symptoms were present when he woke up at 6:30 PM. He has no previous history of stroke nor TIA. He has not been on antiplatelet therapy daily. CT scan of his head was unremarkable with no acute findings. CT angiogram of head and neck was also unremarkable. Patient's deficits resolved except for mild residual numbness involving his left upper extremity. Patient was not administered IV t-PA secondary to minimal, largely subjective deficits. He was admitted for further evaluation and treatment.   SUBJECTIVE (INTERVAL HISTORY) Patient up walking with therapy in the halls, no complaints. Overall he feels his condition is resolved. He would like to go home   OBJECTIVE Temp: [97.9 F (36.6 C)-98.8 F (37.1 C)] 97.9 F (36.6 C) (04/10 1014) Pulse Rate: [61-89] 73 (04/10 1014) Cardiac Rhythm: [-] Normal sinus rhythm;Bundle branch block (04/10 0700) Resp: [12-20] 20 (04/10 1014) BP: (115-139)/(60-93) 128/60 mmHg (04/10 1014) SpO2: [97 %-100 %] 99 % (04/10 1014) Weight: [81.421 kg (179 lb 8 oz)] 81.421 kg (179 lb 8 oz) (04/10 0000)  CBC:   Last Labs      Recent Labs Lab 02/05/16 2021 02/05/16 2022  WBC 6.6 --   NEUTROABS 3.9 --   HGB 14.8 17.0  HCT 45.3 50.0  MCV 84.2 --   PLT 225 --       Basic Metabolic Panel:   Last Labs      Recent Labs Lab 02/05/16 2021 02/05/16 2022 02/06/16 0002 02/06/16 0635  NA 139 141 --  139  K 3.5 3.4* --  3.4*  CL 103 100* --  101  CO2 25 --  --  28  GLUCOSE 97 97 --   108*  BUN 13 17 --  13  CREATININE 1.14 1.00 --  1.10  CALCIUM 9.5 --  --  9.2  MG --  --  2.0 --       Lipid Panel:   Labs (Brief)       Component Value Date/Time   CHOL 220* 02/06/2016 0635   TRIG 194* 02/06/2016 0635   HDL 40* 02/06/2016 0635   CHOLHDL 5.5 02/06/2016 0635   VLDL 39 02/06/2016 0635   LDLCALC 141* 02/06/2016 0635     HgbA1c:    Recent Labs    No results found for: HGBA1C   Urine Drug Screen:   Labs (Brief)       Component Value Date/Time   LABOPIA NONE DETECTED 02/05/2016 2203   COCAINSCRNUR NONE DETECTED 02/05/2016 2203   LABBENZ NONE DETECTED 02/05/2016 2203   AMPHETMU NONE DETECTED 02/05/2016 2203   THCU NONE DETECTED 02/05/2016 2203   LABBARB NONE DETECTED 02/05/2016 2203        IMAGING I have personally reviewed the radiological images below and agree with the radiology interpretations.  Ct Angio Head W/cm &/or Wo Cm 02/05/2016 Normal CT angiography of the head.   Ct Head Wo Contrast 02/05/2016 No acute intracranial pathology.   Ct Angio Neck W/cm &/or Wo/cm 02/05/2016 Normal CT angiography of the neck   Mr Brain Wo Contrast 02/05/2016 Normal noncontrast MRI head.   Mr Jodene Nam Head/brain Wo Cm  02/05/2016 Normal MRA head.   2D Echocardiogram  - Left ventricle: The cavity size was normal. Wall thickness was normal. Systolic function was normal. The estimated ejection fraction was in the range of 55% to 60%. Wall motion was normal; there were no regional wall motion abnormalities. Left ventricular diastolic function parameters were normal. Ejection fraction (MOD, 2-plane): 57%. - Left atrium: The atrium was normal in size. - Inferior vena cava: The vessel was normal in size. The respirophasic diameter changes were in the normal range (>= 50%), consistent with normal central venous pressure. Impressions: Normal study.    PHYSICAL EXAM  Temp:  [97.9 F  (36.6 C)-98.8 F (37.1 C)] 97.9 F (36.6 C) (04/10 1014) Pulse Rate:  [68-74] 73 (04/10 1014) Resp:  [16-20] 20 (04/10 1014) BP: (115-128)/(60-83) 128/60 mmHg (04/10 1014) SpO2:  [98 %-99 %] 99 % (04/10 1014) Weight:  [179 lb 8 oz (81.421 kg)] 179 lb 8 oz (81.421 kg) (04/10 0000)  General - Well nourished, well developed, in no apparent distress.  Ophthalmologic - Sharp disc margins OU.   Cardiovascular - Regular rate and rhythm with no murmur.  Mental Status -  Level of arousal and orientation to time, place, and person were intact. Language including expression, naming, repetition, comprehension was assessed and found intact. Fund of Knowledge was assessed and was intact.  Cranial Nerves II - XII - II - Visual field intact OU. III, IV, VI - Extraocular movements intact. V - Facial sensation intact bilaterally. VII - Facial movement intact bilaterally. VIII - Hearing & vestibular intact bilaterally. X - Palate elevates symmetrically. XI - Chin turning & shoulder shrug intact bilaterally. XII - Tongue protrusion intact.  Motor Strength - The patient's strength was normal in all extremities and pronator drift was absent.  Bulk was normal and fasciculations were absent.   Motor Tone - Muscle tone was assessed at the neck and appendages and was normal.  Reflexes - The patient's reflexes were 1+ in all extremities and he had no pathological reflexes.  Sensory - Light touch, temperature/pinprick were assessed and were symmetrical.    Coordination - The patient had normal movements in the hands and feet with no ataxia or dysmetria.  Tremor was absent.  Gait and Station - The patient's transfers, posture, gait, station, and turns were observed as normal.   ASSESSMENT/PLAN Mr. Charles Shaffer is a 40 y.o. male with history of hypertension and hyperlipidemia presenting with new-onset numbness left upper extremity and subsequently his right upper extremity along with slurred speech. He  did not receive IV t-PA due to minimal, largely subjective deficits.   B UE and trunck numbness, resolved. No stroke, No TIA.  Resultant Neuro deficits resolved  MRI No acute stroke  MRA Unremarkable   CTA head normal  CTA neck normal  2D Echo normal  LDL 141  HgbA1c pending  Lovenox 40 mg sq daily for VTE prophylaxis  Diet regular Room service appropriate?: Yes; Fluid consistency:: Thin  No antithrombotic prior to admission, now on aspirin 81 mg daily . Given no stroke diagnosis (or diabetes), no indication for long-term antithrombotic  Therapy recommendations: No therapy needs  Disposition: Return home  [facility] for discharge from stroke team, no need for neuro follow up. Will sign off.  Hyperlipidemia  Home meds: Fish oil, no statin  LDL 141  On lipitor 40  Continue statin at discharge  Other Stroke Risk Factors  Family hx stroke (brother)  [facility]     Neurology  will sign off. Please call with questions. No neuro follow up needed. Thanks for the consult.  Rosalin Hawking, MD PhD Stroke Neurology 02/06/2016 11:43 PM

## 2016-02-06 NOTE — Progress Notes (Signed)
Patient ID: Charles Shaffer, male   DOB: 04-15-1976, 40 y.o.   MRN: [id number]   Subjective: Charles Shaffer upper extremity numbness has entirely resolved. He feels back to his normal self and is eager to go home today. He would like to follow-up in the clinic, so I will arrange an appointment today. He has no complaints.  Objective: Vital signs in last 24 hours: Filed Vitals:   02/06/16 0400 02/06/16 0600 02/06/16 0800 02/06/16 1014  BP: 115/78 125/83  128/60  Pulse: 73 68 70 73  Temp: 98 F (36.7 C) 98.6 F (37 C)  97.9 F (36.6 C)  TempSrc: Oral Oral  Oral  Resp: 16 16  20   Height:      Weight:      SpO2: 99% 98%  99%   General: resting in bed comfortably, appropriately conversational, joking around HEENT: no scleral icterus, extra-ocular muscles intact, oropharynx without lesions Cardiac: regular rate and rhythm, no rubs, murmurs or gallops Pulm: breathing well, clear to auscultation bilaterally Abd: bowel sounds normal, soft, nondistended, non-tender Ext: warm and well perfused, without pedal edema Lymph: no cervical or supraclavicular lymphadenopathy Skin: [id number] lichenified plaque on left posterior back with some excoriations Neuro: alert and oriented X3, cranial nerves II-XII grossly intact, strength 5/5 throughout, sensation intact to light touch throughout, without any focal neurologic deficits  Lab Results: Fasting Lipid Panel:  Recent Labs Lab 02/06/16 0635  CHOL 220*  HDL 40*  LDLCALC 141*  TRIG 194*  CHOLHDL 5.5   Medications: I have reviewed the patient's current medications. Scheduled Meds: . aspirin  81 mg Oral Daily  . atorvastatin  40 mg Oral q1800  . enoxaparin (LOVENOX) injection  40 mg Subcutaneous Q24H   Continuous Infusions:  PRN Meds:.acetaminophen **OR** acetaminophen, senna-docusate   Assessment/Plan:  Charles Shaffer is a very nice 40 year old man with hyperlipidemia presenting with acute onset bilateral upper extremity numbness; I don't this was a  change in ischemic attack given the bilateral distribution, isolated paresthesias, and normal neurological exam. This was confirmed by normal brain MRI in the setting of his symptoms. We will treat his hyperlipidemia and send him out on a baby aspirin, in case this was indeed an ischemic attack.  Acute onset bilateral upper extremity numbness: Per above. -We'll discharge on aspirin 81 mg daily  Hyperlipidemia: LDL was 140 this admission. We will start a moderate intensity statin for now. -Started atorvastatin 40 mg daily -Will recheck lipid panel in 3 months  Dispo: Discharged to home today.  I have scheduled an appointment for him to see me on May 1 to establish in our primary care clinic.  The patient does not have transportation limitations that hinder transportation to clinic appointments.  .Services Needed at time of discharge: Y = Yes, Blank = No PT:   OT:   RN:   Equipment:   Other:     LOS: 1 Gittings   Loleta Chance, MD 02/06/2016, 11:00 AM

## 2016-02-06 NOTE — Progress Notes (Signed)
OT Cancellation Note  Patient Details Name: Charles Shaffer MRN: [id number] DOB: Jan 19, 1976   Cancelled Treatment:    Reason Eval/Treat Not Completed: OT screened, no needs identified, will sign off. Pt reports BUE numbness and weakness has resolved and pt demonstrates symmetrical 5/5 strength. Pt reports no changes in vision, balance or higher level cognitive functioning. Pt declines needing OT services at this time. OT will sign off.  Redmond Baseman, OTR/L Pager: [phone] 02/06/2016, 11:51 AM

## 2016-02-06 NOTE — Progress Notes (Signed)
Patient ready for discharge to home; discharge instructions given and reviewed;Rx's sent to his pharmacy electronically;patient education completed.

## 2016-02-06 NOTE — Care Management Note (Signed)
Case Management Note  Patient Details  Name: Charles Shaffer MRN: [id number] Date of Birth: 10-12-1976  Subjective/Objective:                    Action/Plan: Patient presented with bilateral upper extremity numbness and slurred speech. Will follow for discharge needs pending patient's progress and physician orders.  Expected Discharge Date:                  Expected Discharge Plan:     In-House Referral:     Discharge planning Services     Post Acute Care Choice:    Choice offered to:     DME Arranged:    DME Agency:     HH Arranged:    HH Agency:     Status of Service:  In process, will continue to follow  Medicare Important Message Given:    Date Medicare IM Given:    Medicare IM give by:    Date Additional Medicare IM Given:    Additional Medicare Important Message give by:     If discussed at [place] of Stay Meetings, dates discussed:    Additional CommentsRolm Baptise, RN 02/06/2016, 11:06 AM [phone]

## 2016-02-06 NOTE — Progress Notes (Signed)
  Echocardiogram 2D Echocardiogram has been performed.  Jennette Dubin 02/06/2016, 8:58 AM

## 2016-02-06 NOTE — Discharge Summary (Signed)
Name: Charles Shaffer MRN: [id number] DOB: 07/02/1976 40 y.o. PCP: No Pcp Per Patient  Date of Admission: 02/05/2016  8:01 PM Date of Discharge: 02/06/2016 Attending Physician: Annia Belt, MD  Discharge Diagnosis: 1. Acute onset bilateral upper extremity numbness of unknown etiology 2. Hyperlipidemia  Discharge Medications:   Medication List    STOP taking these medications        meloxicam 15 MG tablet  Commonly known as:  MOBIC      TAKE these medications        acetaminophen 500 MG tablet  Commonly known as:  TYLENOL  Take 1,000 mg by mouth every 6 (six) hours as needed (pain).     aspirin 81 MG chewable tablet  Chew 1 tablet (81 mg total) by mouth daily.     atorvastatin 40 MG tablet  Commonly known as:  LIPITOR  Take 1 tablet (40 mg total) by mouth daily at 6 PM.     FISH OIL PO  Take 1 capsule by mouth daily.     SELENIUM PO  Take 1 tablet by mouth daily.     triamcinolone ointment 0.1 %  Commonly known as:  KENALOG  Apply topically 2 (two) times daily.     VITAMIN B-12 PO  Take 1 tablet by mouth daily.     VITAMIN D PO  Take 1 tablet by mouth daily.     ZINC PO  Take 1 tablet by mouth daily.        Disposition and follow-up:   [name] was discharged from [facility] in Good condition.  At the hospital follow up visit please address:  1.  Re-check lipid panel in 3-6 months  Follow-up Appointments:     Follow-up Information    Follow up with Loleta Chance, MD. Go on 02/27/2016.   Specialty:  Internal Medicine   Why:  Hospital follow-up at [place] information:   [place] [id number] [phone]      Discharge Instructions: Discharge Instructions    Call MD for:  hives    Complete by:  As directed      Call MD for:  temperature >100.4    Complete by:  As directed      Diet - low sodium heart healthy    Complete by:  As directed      Increase activity slowly    Complete by:  As  directed           Consultations:  Treatment Team:  Md Stroke, MD  Procedures Performed:   Ct Head Wo Contrast  02/05/2016  CLINICAL DATA:  Bilateral upper extremity numbness. EXAM: CT HEAD WITHOUT CONTRAST TECHNIQUE: Contiguous axial images were obtained from the base of the skull through the vertex without intravenous contrast. COMPARISON:  02/05/2016 FINDINGS: There is no evidence of mass effect, midline shift or extra-axial fluid collections. There is no evidence of a space-occupying lesion or intracranial hemorrhage. There is no evidence of a cortical-based area of acute infarction. The ventricles and sulci are appropriate for the patient's age. The basal cisterns are patent. Visualized portions of the orbits are unremarkable. The visualized portions of the paranasal sinuses and mastoid air cells are unremarkable. The osseous structures are unremarkable. IMPRESSION: No acute intracranial pathology. These results were called by telephone at the time of interpretation on 02/05/2016 at 8:16 pm to Dr. Epifania Gore, who verbally acknowledged these results. Electronically Signed   By: Kathreen Devoid  On: 02/05/2016 20:18   Ct Angio Neck W/cm &/or Wo/cm  02/05/2016  CLINICAL DATA:  Weakness and numbness of the upper extremities. EXAM: CT ANGIOGRAPHY HEAD AND NECK TECHNIQUE: Multidetector CT imaging of the head and neck was performed using the standard protocol during bolus administration of intravenous contrast. Multiplanar CT image reconstructions and MIPs were obtained to evaluate the vascular anatomy. Carotid stenosis measurements (when applicable) are obtained utilizing NASCET criteria, using the distal internal carotid diameter as the denominator. CONTRAST:  50 cc Isovue 370 COMPARISON:  Head CT earlier same [place] FINDINGS: CTA NECK Aortic arch: Aortic arch appears normal. No atherosclerotic change, aneurysm or dissection. Branching pattern of the brachiocephalic vessels from the arch is normal. Right carotid  system: Common carotid artery widely patent to the bifurcation. Normal appearing bifurcation. Cervical internal carotid artery is normal. Left carotid system: Common carotid artery widely patent to the bifurcation. Carotid bifurcation is normal. Cervical internal carotid artery is normal. Vertebral arteries:Left vertebral artery dominant. Both vertebral artery origins appear normal. Both vertebral arteries normal through the cervical region. Skeleton: Normal Other neck: Normal CTA HEAD Anterior circulation: Both internal carotid arteries widely patent through the siphon regions. The anterior and middle cerebral vessels are normal without proximal stenosis, aneurysm or vascular malformation. Posterior circulation: Both vertebral arteries are patent to the basilar. No basilar stenosis. Posterior circulation branch vessels are normal. Venous sinuses: Patent and normal Anatomic variants: None significant Delayed phase: No abnormal enhancement IMPRESSION: Normal CT angiography of the neck and the head. Electronically Signed   By: Nelson Chimes M.D.   On: 02/05/2016 20:37   Mr Brain Wo Contrast  02/05/2016  CLINICAL DATA:  Acute onset LEFT then RIGHT upper extremity numbness, with slurred speech. Symptoms present when waking up at 6:30 p.m. Mild residual LEFT upper extremity numbness. History of hypertension and hypercholesterolemia. EXAM: MRI HEAD WITHOUT CONTRAST MRA HEAD WITHOUT CONTRAST TECHNIQUE: Multiplanar, multiecho pulse sequences of the brain and surrounding structures were obtained without intravenous contrast. Angiographic images of the head were obtained using MRA technique without contrast. COMPARISON:  CT head February 05, 2016 at 2002 hours and CTA head and neck February 05, 2016 at 2015 hours FINDINGS: MRI HEAD FINDINGS The ventricles and sulci are normal for patient's age. No abnormal parenchymal signal, mass lesions, mass effect. No reduced diffusion to suggest acute ischemia. No susceptibility artifact to  suggest hemorrhage. No abnormal extra-axial fluid collections. No extra-axial masses though, contrast enhanced sequences would be more sensitive. Normal major intracranial vascular flow voids seen at the skull base. Ocular globes and orbital contents are unremarkable though not tailored for evaluation. No abnormal sellar expansion. No suspicious calvarial bone marrow signal. Craniocervical junction maintained. Mild paranasal sinus mucosal thickening with RIGHT maxillary mucosal retention cyst. Trace LEFT mastoid effusion. MRA HEAD FINDINGS Anterior circulation: Normal flow related enhancement of the included cervical, petrous, cavernous and supraclinoid internal carotid arteries. Patent anterior communicating artery. Normal flow related enhancement of the anterior and middle cerebral arteries, including distal segments. No large vessel occlusion, high-grade stenosis, abnormal luminal irregularity, aneurysm. Posterior circulation: Codominant vertebral arteries. Basilar artery is patent, with normal flow related enhancement of the main branch vessels. Normal flow related enhancement of the posterior cerebral arteries. No large vessel occlusion, high-grade stenosis, abnormal luminal irregularity, aneurysm. IMPRESSION: Normal noncontrast MRI head. Normal MRA head. Electronically Signed   By: Elon Alas M.D.   On: 02/05/2016 23:39   2D Echo:   Study Conclusions  - Left ventricle: The cavity size was  normal. Wall thickness was  normal. Systolic function was normal. The estimated ejection  fraction was in the range of 55% to 60%. Wall motion was normal;  there were no regional wall motion abnormalities. Left  ventricular diastolic function parameters were normal. Ejection  fraction (MOD, 2-plane): 57%. - Left atrium: The atrium was normal in size. - Inferior vena cava: The vessel was normal in size. The  respirophasic diameter changes were in the normal range (>= 50%),  consistent with normal  central venous pressure.  Impressions:  - Normal study.  Admission HPI:   Mr. Charles Shaffer is a 40yo with carpal tunnel syndrome, hyperlipidemia and possible HTN who presents with bilateral upper extremity numbness and slurred speech since 6:30pm today. He felt his normal self when taking a nap at 3:30pm, but woke up at 6:30pm with numbness in both his arms and going across his chest, as well as slurred speech. Both of these symptoms have gradually improved, with some residual LUE sx but are now resolved. He has never had these symptoms before. He says he experiences hand weakness with his carpal tunnel, and these current symptoms are totally different. He does say he had a recent URI ~2 weeks ago. He also noticed a pruritic rash on his left back over the past week, as well as a 'cyst' on his right shoulder, and a little muscle tightness on the left side of his posterior neck. Otherwise, he denies trauma, fevers, headache, vision changes, lightheadedness, vertigo/tinnitus, facial droop, any pain, any weakness, chest pain, shortness of breath, N/V/D, urinary symptoms, sick contacts, tick bites, recent camping adventures or other travel. He is a never smoker, denies EtOH abuse, or any recreational/IVDU.  Hospital Course by problem list:   1. Acute onset bilateral upper extremity numbness of unknown etiology: He presented with bilateral upper extremity numbness and slurred speech resolved after a few hours; I do not believe this was a transient ischemic attack given the bilateral distribution, and normal brain MRI. Nor do I believe this was multiple sclerosis as he denied any other neurologic symptoms in the past, but this is something we should keep in consideration should he have future neurologic symptoms separated in both time and space. He did not complain of any neck pain concerning for a cervical spinal cord injury, and his symptoms resolve spontaneously. Nor did he have a headache suggestive of a complex  migraine. Regardless, given the concern for a transient ischemic attack, he was discharged on aspirin 81 mg daily.  2. Hyperlipidemia: His LDL this admission was 140. He was started on atorvastatin 40 mg daily; we will need to recheck his lipid panel in 3-6 months.  Discharge Vitals:   BP 128/60 mmHg  Pulse 73  Temp(Src) 97.9 F (36.6 C) (Oral)  Resp 20  Ht 5\' 7"  (1.702 m)  Wt 179 lb 8 oz (81.421 kg)  BMI 28.11 kg/m2  SpO2 99%  Discharge Labs:  Results for orders placed or performed during the hospital encounter of 02/05/16 (from the past 24 hour(s))  Ethanol     Status: None   Collection Time: 02/05/16  8:20 PM  Result Value Ref Range   Alcohol, Ethyl (B) <5 <5 mg/dL  I-stat troponin, ED (not at [facility], [facility])     Status: None   Collection Time: 02/05/16  8:20 PM  Result Value Ref Range   Troponin i, poc 0.00 0.00 - 0.08 ng/mL   Comment 3          Protime-INR  Status: None   Collection Time: 02/05/16  8:21 PM  Result Value Ref Range   Prothrombin Time 13.3 11.6 - 15.2 seconds   INR 0.99 0.00 - 1.49  APTT     Status: None   Collection Time: 02/05/16  8:21 PM  Result Value Ref Range   aPTT 30 24 - 37 seconds  CBC     Status: None   Collection Time: 02/05/16  8:21 PM  Result Value Ref Range   WBC 6.6 4.0 - 10.5 K/uL   RBC 5.38 4.22 - 5.81 MIL/uL   Hemoglobin 14.8 13.0 - 17.0 g/dL   HCT 45.3 39.0 - 52.0 %   MCV 84.2 78.0 - 100.0 fL   MCH 27.5 26.0 - 34.0 pg   MCHC 32.7 30.0 - 36.0 g/dL   RDW 12.6 11.5 - 15.5 %   Platelets 225 150 - 400 K/uL  Differential     Status: None   Collection Time: 02/05/16  8:21 PM  Result Value Ref Range   Neutrophils Relative % 60 %   Neutro Abs 3.9 1.7 - 7.7 K/uL   Lymphocytes Relative 29 %   Lymphs Abs 1.9 0.7 - 4.0 K/uL   Monocytes Relative 9 %   Monocytes Absolute 0.6 0.1 - 1.0 K/uL   Eosinophils Relative 2 %   Eosinophils Absolute 0.2 0.0 - 0.7 K/uL   Basophils Relative 0 %   Basophils Absolute 0.0 0.0 - 0.1 K/uL  Comprehensive  metabolic panel     Status: None   Collection Time: 02/05/16  8:21 PM  Result Value Ref Range   Sodium 139 135 - 145 mmol/L   Potassium 3.5 3.5 - 5.1 mmol/L   Chloride 103 101 - 111 mmol/L   CO2 25 22 - 32 mmol/L   Glucose, Bld 97 65 - 99 mg/dL   BUN 13 6 - 20 mg/dL   Creatinine, Ser 1.14 0.61 - 1.24 mg/dL   Calcium 9.5 8.9 - 10.3 mg/dL   Total Protein 7.8 6.5 - 8.1 g/dL   Albumin 3.8 3.5 - 5.0 g/dL   AST 27 15 - 41 U/L   ALT 25 17 - 63 U/L   Alkaline Phosphatase 57 38 - 126 U/L   Total Bilirubin 0.9 0.3 - 1.2 mg/dL   GFR calc non Af Amer >60 >60 mL/min   GFR calc Af Amer >60 >60 mL/min   Anion gap 11 5 - 15  I-Stat Chem 8, ED  (not at [facility], [facility])     Status: Abnormal   Collection Time: 02/05/16  8:22 PM  Result Value Ref Range   Sodium 141 135 - 145 mmol/L   Potassium 3.4 (L) 3.5 - 5.1 mmol/L   Chloride 100 (L) 101 - 111 mmol/L   BUN 17 6 - 20 mg/dL   Creatinine, Ser 1.00 0.61 - 1.24 mg/dL   Glucose, Bld 97 65 - 99 mg/dL   Calcium, Ion 1.17 1.12 - 1.23 mmol/L   TCO2 27 0 - 100 mmol/L   Hemoglobin 17.0 13.0 - 17.0 g/dL   HCT 50.0 39.0 - 52.0 %  Urine rapid drug screen (hosp performed)not at [facility]     Status: None   Collection Time: 02/05/16 10:03 PM  Result Value Ref Range   Opiates NONE DETECTED NONE DETECTED   Cocaine NONE DETECTED NONE DETECTED   Benzodiazepines NONE DETECTED NONE DETECTED   Amphetamines NONE DETECTED NONE DETECTED   Tetrahydrocannabinol NONE DETECTED NONE DETECTED   Barbiturates NONE DETECTED NONE  DETECTED  Urinalysis, Routine w reflex microscopic (not at [facility])     Status: Abnormal   Collection Time: 02/05/16 10:03 PM  Result Value Ref Range   Color, Urine YELLOW YELLOW   APPearance CLEAR CLEAR   Specific Gravity, Urine >1.046 (H) 1.005 - 1.030   pH 6.5 5.0 - 8.0   Glucose, UA NEGATIVE NEGATIVE mg/dL   Hgb urine dipstick NEGATIVE NEGATIVE   Bilirubin Urine NEGATIVE NEGATIVE   Ketones, ur NEGATIVE NEGATIVE mg/dL   Protein, ur NEGATIVE NEGATIVE  mg/dL   Nitrite NEGATIVE NEGATIVE   Leukocytes, UA NEGATIVE NEGATIVE  Magnesium     Status: None   Collection Time: 02/06/16 12:02 AM  Result Value Ref Range   Magnesium 2.0 1.7 - 2.4 mg/dL  Lipid panel     Status: Abnormal   Collection Time: 02/06/16  6:35 AM  Result Value Ref Range   Cholesterol 220 (H) 0 - 200 mg/dL   Triglycerides 194 (H) <150 mg/dL   HDL 40 (L) >40 mg/dL   Total CHOL/HDL Ratio 5.5 RATIO   VLDL 39 0 - 40 mg/dL   LDL Cholesterol 141 (H) 0 - 99 mg/dL  Basic metabolic panel     Status: Abnormal   Collection Time: 02/06/16  6:35 AM  Result Value Ref Range   Sodium 139 135 - 145 mmol/L   Potassium 3.4 (L) 3.5 - 5.1 mmol/L   Chloride 101 101 - 111 mmol/L   CO2 28 22 - 32 mmol/L   Glucose, Bld 108 (H) 65 - 99 mg/dL   BUN 13 6 - 20 mg/dL   Creatinine, Ser 1.10 0.61 - 1.24 mg/dL   Calcium 9.2 8.9 - 10.3 mg/dL   GFR calc non Af Amer >60 >60 mL/min   GFR calc Af Amer >60 >60 mL/min   Anion gap 10 5 - 15    Signed: Loleta Chance, MD 02/06/2016, 11:41 AM

## 2016-02-06 NOTE — Telephone Encounter (Signed)
Still inpt

## 2016-02-06 NOTE — Care Management Note (Signed)
Case Management Note  Patient Details  Name: Srinath Blackmer MRN: [id number] Date of Birth: 01/20/1976  Subjective/Objective:                    Action/Plan: Patient being discharged home with self care. Pt has no PCP. CM provided him the [facility] number to assist him in finding a PCP. No further needs per CM.   Expected Discharge Date:                  Expected Discharge Plan:  Home/Self Care  In-House Referral:     Discharge planning Services  CM Consult  Post Acute Care Choice:    Choice offered to:     DME Arranged:    DME Agency:     HH Arranged:    [place] Agency:     Status of Service:  Completed, signed off  Medicare Important Message Given:    Date Medicare IM Given:    Medicare IM give by:    Date Additional Medicare IM Given:    Additional Medicare Important Message give by:     If discussed at [place] of Stay Meetings, dates discussed:    Additional Comments:  Pollie Friar, RN 02/06/2016, 11:59 AM

## 2016-02-06 NOTE — Progress Notes (Signed)
STROKE TEAM PROGRESS NOTE   HISTORY OF PRESENT ILLNESS Charles Shaffer is an 40 y.o. male with a history of hyperlipidemia as well as possible hypertension with poor compliance with treatment, presenting with new onset numbness involving initially left upper extremity and subsequently involving his right upper extremity, along with slurred speech. Patient was last known well at 3:30 PM on 02/05/2016. He took a nap and symptoms were present when he woke up at 6:30 PM. He has no previous history of stroke nor TIA. He has not been on antiplatelet therapy daily. CT scan of his head was unremarkable with no acute findings. CT angiogram of head and neck was also unremarkable. Patient's deficits resolved except for mild residual numbness involving his left upper extremity. Patient was not administered IV t-PA secondary to minimal, largely subjective deficits. He was admitted for further evaluation and treatment.   SUBJECTIVE (INTERVAL HISTORY) Patient up walking with therapy in the halls, no complaints. Overall he feels his condition is stable. He would like to go home   OBJECTIVE Temp:  [97.9 F (36.6 C)-98.8 F (37.1 C)] 97.9 F (36.6 C) (04/10 1014) Pulse Rate:  [61-89] 73 (04/10 1014) Cardiac Rhythm:  [-] Normal sinus rhythm;Bundle branch block (04/10 0700) Resp:  [12-20] 20 (04/10 1014) BP: (115-139)/(60-93) 128/60 mmHg (04/10 1014) SpO2:  [97 %-100 %] 99 % (04/10 1014) Weight:  [81.421 kg (179 lb 8 oz)] 81.421 kg (179 lb 8 oz) (04/10 0000)  CBC:   Recent Labs Lab 02/05/16 2021 02/05/16 2022  WBC 6.6  --   NEUTROABS 3.9  --   HGB 14.8 17.0  HCT 45.3 50.0  MCV 84.2  --   PLT 225  --     Basic Metabolic Panel:   Recent Labs Lab 02/05/16 2021 02/05/16 2022 02/06/16 0002 02/06/16 0635  NA 139 141  --  139  K 3.5 3.4*  --  3.4*  CL 103 100*  --  101  CO2 25  --   --  28  GLUCOSE 97 97  --  108*  BUN 13 17  --  13  CREATININE 1.14 1.00  --  1.10  CALCIUM 9.5  --   --  9.2  MG   --   --  2.0  --     Lipid Panel:     Component Value Date/Time   CHOL 220* 02/06/2016 0635   TRIG 194* 02/06/2016 0635   HDL 40* 02/06/2016 0635   CHOLHDL 5.5 02/06/2016 0635   VLDL 39 02/06/2016 0635   LDLCALC 141* 02/06/2016 0635   HgbA1c: No results found for: HGBA1C Urine Drug Screen:     Component Value Date/Time   LABOPIA NONE DETECTED 02/05/2016 2203   COCAINSCRNUR NONE DETECTED 02/05/2016 2203   LABBENZ NONE DETECTED 02/05/2016 2203   AMPHETMU NONE DETECTED 02/05/2016 2203   THCU NONE DETECTED 02/05/2016 2203   LABBARB NONE DETECTED 02/05/2016 2203      IMAGING  Ct Angio Head W/cm &/or Wo Cm 02/05/2016  Normal CT angiography of the head.   Ct Head Wo Contrast 02/05/2016  No acute intracranial pathology.   Ct Angio Neck W/cm &/or Wo/cm 02/05/2016  Normal CT angiography of the neck   Mr Brain Wo Contrast 02/05/2016  Normal noncontrast MRI head.   Mr Jodene Nam Head/brain Wo Cm 02/05/2016   Normal MRA head.   2D Echocardiogram  - Left ventricle: The cavity size was normal. Wall thickness was normal. Systolic function was normal. The estimated ejection fraction  was in the range of 55% to 60%. Wall motion was normal; there were no regional wall motion abnormalities. Left ventricular diastolic function parameters were normal. Ejection fraction (MOD, 2-plane): 57%. - Left atrium: The atrium was normal in size. - Inferior vena cava: The vessel was normal in size. The respirophasic diameter changes were in the normal range (>= 50%), consistent with normal central venous pressure. Impressions:   Normal study.    PHYSICAL EXAM   ASSESSMENT/PLAN Mr. Charles Shaffer is a 40 y.o. male with history of hypertension and hyperlipidemia presenting with new-onset numbness left upper extremity and subsequently his right upper extremity along with slurred speech. He did not receive IV t-PA due to minimal, largely subjective deficits.   B UE and trunck numbness, resolved. No stroke, No  TIA.  Resultant  Neuro deficits resolved  MRI  No acute stroke  MRA  Unremarkable   CTA head normal  CTA neck normal  2D Echo  normal  LDL 141  HgbA1c pending  Lovenox 40 mg sq daily for VTE prophylaxis Diet regular Room service appropriate?: Yes; Fluid consistency:: Thin  No antithrombotic prior to admission, now on aspirin 81 mg daily . Given no stroke diagnosis (or diabetes), no indication for long-term antithrombotic  Therapy recommendations:  No therapy needs  Disposition:  Return home  [facility] for discharge from stroke team, no need for neuro follow up. Will sign off.  Hyperlipidemia  Home meds:  Fish oil, no statin  LDL 141  Recommend treatment by primary team, healthy diet encouraged`  Continue statin at discharge  Other Stroke Risk Factors  Family hx stroke (brother)  [facility] # [place] for Pager information 02/06/2016 11:28 AM     To contact Stroke Continuity provider, please refer to [email or web address] After hours, contact General Neurology

## 2016-02-07 LAB — CARDIOLIPIN ANTIBODIES, IGG, IGM, IGA: Anticardiolipin IgG: <9 GPL U/mL (ref 0–14)

## 2016-02-07 LAB — HEMOGLOBIN A1C
HEMOGLOBIN A1C: 5.8 % — AB (ref 4.8–5.6)
Mean Plasma Glucose: 120 mg/dL

## 2016-02-07 LAB — LUPUS ANTICOAGULANT PANEL
DRVVT: 30.8 s (ref 0.0–44.0)
PTT LA: 35.2 s (ref 0.0–43.6)

## 2016-02-07 LAB — BETA-2-GLYCOPROTEIN I ABS, IGG/M/A
Beta-2 Glyco I IgG: <9 GPI IgG units (ref 0–20)
Beta-2-Glycoprotein I IgA: <9 GPI IgA units (ref 0–25)

## 2016-02-17 ENCOUNTER — Telehealth: Payer: Self-pay | Admitting: *Deleted

## 2016-02-17 NOTE — Telephone Encounter (Signed)
-----   Message from Annia Belt, MD sent at 02/15/2016  1:54 PM EDT ----- Call pt: bloodwork we did in the hospital to see if he was making antibodies against his clotting factors came back negative

## 2016-02-17 NOTE — Telephone Encounter (Signed)
Pt called / informed "bloodwork we did in the hospital to see if he was making antibodies against his clotting factors came back negative" per Dr Beryle Beams.

## 2016-02-20 NOTE — Telephone Encounter (Signed)
Transition Care Management Follow-up Telephone Call   Date discharged?   How have you been since you were released from the hospit   Do you understand why you were in the hospital?    Do you understand the discharge instructions?    Where were you discharged to?    Items Reviewed:  Medications reviewed:   Allergies reviewed:   Dietary changes reviewed:   Referrals reviewed:   Functional Questionnaire:   Activities of Daily Living (ADLs):   He states they are independent in the following: not home States they require assistance with the following: not home   Any transportation issues/concerns?   Any patient concerns?    Confirmed importance and date/time of follow-up visits scheduled   Provider Appointment booked with  Confirmed with patient if condition begins to worsen call PCP or go to the ER.  Patient was given the office number and encouraged to call back with question or concerns.   No answer.. Lm for rtc

## 2016-02-27 ENCOUNTER — Ambulatory Visit: Payer: Managed Care, Other (non HMO) | Admitting: Internal Medicine

## 2016-12-23 ENCOUNTER — Emergency Department (HOSPITAL_COMMUNITY)
Admission: EM | Admit: 2016-12-23 | Discharge: 2016-12-23 | Disposition: A | Discharge status: Home / Self Care | Payer: Managed Care, Other (non HMO) | Attending: Emergency Medicine | Admitting: Emergency Medicine

## 2016-12-23 ENCOUNTER — Encounter (HOSPITAL_COMMUNITY): Payer: Self-pay | Admitting: Emergency Medicine

## 2016-12-23 DIAGNOSIS — S40812A Abrasion of left upper arm, initial encounter: Secondary | ICD-10-CM | POA: Insufficient documentation

## 2016-12-23 DIAGNOSIS — S40811A Abrasion of right upper arm, initial encounter: Secondary | ICD-10-CM | POA: Insufficient documentation

## 2016-12-23 DIAGNOSIS — Y92410 Unspecified street and highway as the place of occurrence of the external cause: Secondary | ICD-10-CM | POA: Diagnosis not present

## 2016-12-23 DIAGNOSIS — S8012XA Contusion of left lower leg, initial encounter: Secondary | ICD-10-CM | POA: Diagnosis not present

## 2016-12-23 DIAGNOSIS — S8992XA Unspecified injury of left lower leg, initial encounter: Secondary | ICD-10-CM | POA: Diagnosis present

## 2016-12-23 DIAGNOSIS — S30810A Abrasion of lower back and pelvis, initial encounter: Secondary | ICD-10-CM | POA: Insufficient documentation

## 2016-12-23 DIAGNOSIS — T07XXXA Unspecified multiple injuries, initial encounter: Secondary | ICD-10-CM

## 2016-12-23 DIAGNOSIS — Z7982 Long term (current) use of aspirin: Secondary | ICD-10-CM | POA: Diagnosis not present

## 2016-12-23 DIAGNOSIS — Y929 Unspecified place or not applicable: Secondary | ICD-10-CM | POA: Insufficient documentation

## 2016-12-23 DIAGNOSIS — Y999 Unspecified external cause status: Secondary | ICD-10-CM | POA: Insufficient documentation

## 2016-12-23 DIAGNOSIS — I10 Essential (primary) hypertension: Secondary | ICD-10-CM | POA: Diagnosis not present

## 2016-12-23 DIAGNOSIS — Y939 Activity, unspecified: Secondary | ICD-10-CM | POA: Insufficient documentation

## 2016-12-23 MED ORDER — IBUPROFEN 800 MG PO TABS: 800.0000 mg | ORAL_TABLET | Freq: Three times a day (TID) | ORAL | 0 refills | Status: DC

## 2016-12-23 MED ORDER — METHOCARBAMOL 500 MG PO TABS: 500.0000 mg | ORAL_TABLET | Freq: Two times a day (BID) | ORAL | 0 refills | Status: DC
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
## 2016-12-23 NOTE — ED Notes (Signed)
Declined W/C at D/C and was escorted to lobby by RN. 

## 2017-09-19 IMAGING — CT CT ANGIO NECK
1 of 9 series · 1 of 33 positions shown · IV contrast (Iohexol (Omnipaque 350))
Comparison: Head CT earlier same day

CLINICAL DATA: Weakness and numbness of the upper extremities.

EXAM:
CT ANGIOGRAPHY HEAD AND NECK
TECHNIQUE: Multidetector CT imaging of the head and neck was performed using
the standard protocol during bolus administration of intravenous
contrast. Multiplanar CT image reconstructions and MIPs were
obtained to evaluate the vascular anatomy. Carotid stenosis
measurements (when applicable) are obtained utilizing NASCET
criteria, using the distal internal carotid diameter as the
denominator.
CONTRAST:  50 cc Isovue 370

[Series 200: locator · axial · 0.31mm/px · 1 of 1 slices shown]
[im 1/1  soft-tissue]
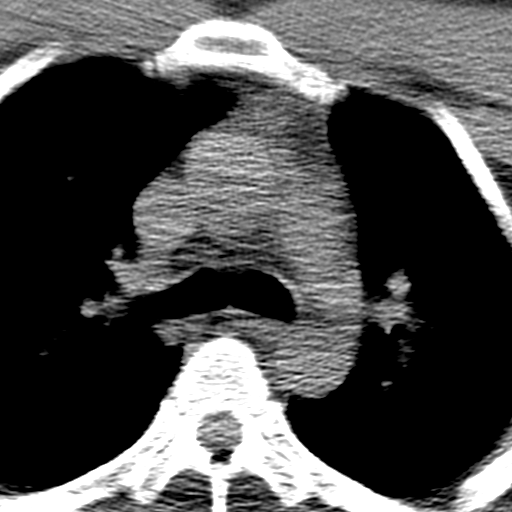

[1 of 33 positions shown; findings below may reference images not displayed]

FINDINGS: CTA NECK

Aortic arch: Aortic arch appears normal. No atherosclerotic change,
aneurysm or dissection. Branching pattern of the brachiocephalic
vessels from the arch is normal.

Right carotid system: Common carotid artery widely patent to the
bifurcation. Normal appearing bifurcation. Cervical internal carotid
artery is normal.

Left carotid system: Common carotid artery widely patent to the
bifurcation. Carotid bifurcation is normal. Cervical internal
carotid artery is normal.

Vertebral arteries:Left vertebral artery dominant. Both vertebral
artery origins appear normal. Both vertebral arteries normal through
the cervical region.

Skeleton: Normal

Other neck: Normal

CTA HEAD

Anterior circulation: Both internal carotid arteries widely patent
through the siphon regions. The anterior and middle cerebral vessels
are normal without proximal stenosis, aneurysm or vascular
malformation.

Posterior circulation: Both vertebral arteries are patent to the
basilar. No basilar stenosis. Posterior circulation branch vessels
are normal.

Venous sinuses: Patent and normal

Anatomic variants: None significant

Delayed phase: No abnormal enhancement
IMPRESSION: Normal CT angiography of the neck and the head.

## 2018-05-07 ENCOUNTER — Ambulatory Visit (INDEPENDENT_AMBULATORY_CARE_PROVIDER_SITE_OTHER): Payer: Managed Care, Other (non HMO) | Admitting: Physician Assistant

## 2018-05-15 ENCOUNTER — Other Ambulatory Visit: Payer: Self-pay

## 2018-05-15 ENCOUNTER — Ambulatory Visit (INDEPENDENT_AMBULATORY_CARE_PROVIDER_SITE_OTHER): Payer: BLUE CROSS/BLUE SHIELD | Admitting: Physician Assistant

## 2018-05-15 ENCOUNTER — Encounter (INDEPENDENT_AMBULATORY_CARE_PROVIDER_SITE_OTHER): Payer: Self-pay | Admitting: Physician Assistant

## 2018-05-15 VITALS — BP 131/90 | HR 82 | Temp 98.1°F | Ht 66.5 in | Wt 204.0 lb

## 2018-05-15 DIAGNOSIS — T148XXA Other injury of unspecified body region, initial encounter: Secondary | ICD-10-CM | POA: Diagnosis not present

## 2018-05-15 DIAGNOSIS — R7303 Prediabetes: Secondary | ICD-10-CM | POA: Diagnosis not present

## 2018-05-15 DIAGNOSIS — Z131 Encounter for screening for diabetes mellitus: Secondary | ICD-10-CM | POA: Diagnosis not present

## 2018-05-15 DIAGNOSIS — Z0000 Encounter for general adult medical examination without abnormal findings: Secondary | ICD-10-CM | POA: Diagnosis not present

## 2018-05-15 LAB — POCT GLYCOSYLATED HEMOGLOBIN (HGB A1C): HEMOGLOBIN A1C: 5.7 % — AB (ref 4.0–5.6)

## 2018-05-15 MED ORDER — CYCLOBENZAPRINE HCL 10 MG PO TABS: 10.0000 mg | ORAL_TABLET | Freq: Three times a day (TID) | ORAL | 1 refills | Status: DC | PRN

## 2018-05-15 NOTE — Progress Notes (Signed)
Subjective:  Patient ID: Charles Shaffer, male    DOB: 1976-10-26  Age: 42 y.o. MRN: [id number]  CC: Annual exam  HPI Charles Shaffer is a 42 y.o. male with a medical history of HTN, HLD, and CTS presents as a new patient for an annual physical. 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 Has not taken anything for relief. Does not endorse any neurological deficits. Does not endorse any other symptoms or complaints.      Outpatient Medications Prior to Visit  Medication Sig Dispense Refill  . acetaminophen (TYLENOL) 500 MG tablet Take 1,000 mg by mouth every 6 (six) hours as needed (pain).    Marland Kitchen aspirin 81 MG chewable tablet Chew 1 tablet (81 mg total) by mouth daily. 90 tablet 3  . atorvastatin (LIPITOR) 40 MG tablet Take 1 tablet (40 mg total) by mouth daily at 6 PM. 90 tablet 3  . Cholecalciferol (VITAMIN D PO) Take 1 tablet by mouth daily.    . Cyanocobalamin (VITAMIN B-12 PO) Take 1 tablet by mouth daily.    Marland Kitchen ibuprofen (ADVIL,MOTRIN) 800 MG tablet Take 1 tablet (800 mg total) by mouth 3 (three) times daily. 21 tablet 0  . methocarbamol (ROBAXIN) 500 MG tablet Take 1 tablet (500 mg total) by mouth 2 (two) times daily. 20 tablet 0  . Multiple Vitamins-Minerals (ZINC PO) Take 1 tablet by mouth daily.    . Omega-3 Fatty Acids (FISH OIL PO) Take 1 capsule by mouth daily.    . SELENIUM PO Take 1 tablet by mouth daily.    Marland Kitchen triamcinolone ointment (KENALOG) 0.1 % Apply topically 2 (two) times daily. 30 g 0   No facility-administered medications prior to visit.      ROS Review of Systems  Constitutional: Negative for chills, fever and malaise/fatigue.  Eyes: Negative for blurred vision.  Respiratory: Negative for shortness of breath.   Cardiovascular: Negative for chest pain and palpitations.  Gastrointestinal: Negative for abdominal pain and nausea.  Genitourinary: Negative for dysuria and hematuria.  Musculoskeletal: Negative for  joint pain and myalgias.       Back stiffness  Skin: Negative for rash.  Neurological: Negative for tingling and headaches.  Psychiatric/Behavioral: Negative for depression. The patient is not nervous/anxious.     Objective:  BP 131/90 (BP Location: Left Arm, Patient Position: Sitting, Cuff Size: Large)   Pulse 82   Temp 98.1 F (36.7 C) (Oral)   Ht 5' 6.5" (1.689 m)   Wt 204 lb (92.5 kg)   SpO2 93%   BMI 32.43 kg/m   BP/Weight 05/15/2018 12/23/2016 07/21/3006  Systolic BP 622 633 354  Diastolic BP 90 93 60  Wt. (Lbs) 204 - 179.5  BMI 32.43 - -      Physical Exam  Constitutional: He is oriented to person, place, and time.  Well developed, well nourished, NAD, polite  HENT:  Head: Normocephalic and atraumatic.  Eyes: No scleral icterus.  Neck: Normal range of motion. Neck supple. No thyromegaly present.  Cardiovascular: Normal rate, regular rhythm and normal heart sounds.  Pulmonary/Chest: Effort normal and breath sounds normal.  Abdominal: Soft. Bowel sounds are normal. There is no tenderness.  Genitourinary:  Genitourinary Comments: Pt declined  Musculoskeletal: He exhibits no edema.  Full aROM of the UEs, LEs, and back.  Neurological: He is alert and oriented to person, place, and time. He displays normal reflexes. No cranial nerve deficit. He exhibits abnormal  muscle tone (increased muscular tone of the L and T spine). Coordination normal.  Skin: Skin is warm and dry. No rash noted. No erythema. No pallor.  Psychiatric: He has a normal mood and affect. His behavior is normal. Thought content normal.  Vitals reviewed.    Assessment & Plan:   1. Annual physical exam - CBC with Differential - Comprehensive metabolic panel - Lipid panel  2. Prediabetes - I have advised pt to keep a low carb diet and exercise regularly.  3. Screening for diabetes mellitus - HgB A1c 5.7%  4. Muscle strain - Begin cyclobenzaprine (FLEXERIL) 10 MG tablet; Take 1 tablet (10 mg  total) by mouth 3 (three) times daily as needed for muscle spasms.  Dispense: 10 tablet; Refill: 1   Meds ordered this encounter  Medications  . cyclobenzaprine (FLEXERIL) 10 MG tablet    Sig: Take 1 tablet (10 mg total) by mouth 3 (three) times daily as needed for muscle spasms.    Dispense:  10 tablet    Refill:  1    Order Specific Question:   Supervising Provider    Answer:   Charlott Rakes [[id number]]    Follow-up: Return if symptoms worsen or fail to improve.   Clent Demark PA

## 2018-05-15 NOTE — Patient Instructions (Signed)
Muscle Strain A muscle strain (pulled muscle) happens when a muscle is stretched beyond normal length. It happens when a sudden, violent force stretches your muscle too far. Usually, a few of the fibers in your muscle are torn. Muscle strain is common in athletes. Recovery usually takes 1-2 weeks. Complete healing takes 5-6 weeks. Follow these instructions at home:  Follow the PRICE method of treatment to help your injury get better. Do this the first 2-3 days after the injury: ? Protect. Protect the muscle to keep it from getting injured again. ? Rest. Limit your activity and rest the injured body part. ? Ice. Put ice in a plastic bag. Place a towel between your skin and the bag. Then, apply the ice and leave it on from 15-20 minutes each hour. After the third Shein, switch to moist heat packs. ? Compression. Use a splint or elastic bandage on the injured area for comfort. Do not put it on too tightly. ? Elevate. Keep the injured body part above the level of your heart.  Only take medicine as told by your doctor.  Warm up before doing exercise to prevent future muscle strains. Contact a doctor if:  You have more pain or puffiness (swelling) in the injured area.  You feel numbness, tingling, or notice a loss of strength in the injured area. This information is not intended to replace advice given to you by your health care provider. Make sure you discuss any questions you have with your health care provider. Document Released: 07/24/2008 Document Revised: 03/22/2016 Document Reviewed: 05/14/2013 Elsevier Interactive Patient Education  2017 [employer]  

## 2018-05-16 ENCOUNTER — Telehealth (INDEPENDENT_AMBULATORY_CARE_PROVIDER_SITE_OTHER): Payer: Self-pay

## 2018-05-16 ENCOUNTER — Other Ambulatory Visit (INDEPENDENT_AMBULATORY_CARE_PROVIDER_SITE_OTHER): Payer: Self-pay | Admitting: Physician Assistant

## 2018-05-16 DIAGNOSIS — E781 Pure hyperglyceridemia: Secondary | ICD-10-CM

## 2018-05-16 DIAGNOSIS — E7841 Elevated Lipoprotein(a): Secondary | ICD-10-CM

## 2018-05-16 LAB — COMPREHENSIVE METABOLIC PANEL
A/G RATIO: 1.6 (ref 1.2–2.2)
ALT: 38 IU/L (ref 0–44)
AST: 29 IU/L (ref 0–40)
Albumin: 4.8 g/dL (ref 3.5–5.5)
Alkaline Phosphatase: 58 IU/L (ref 39–117)
BILIRUBIN TOTAL: 0.5 mg/dL (ref 0.0–1.2)
BUN/Creatinine Ratio: 12 (ref 9–20)
BUN: 13 mg/dL (ref 6–20)
CHLORIDE: 98 mmol/L (ref 96–106)
CO2: 25 mmol/L (ref 20–29)
Calcium: 9.9 mg/dL (ref 8.7–10.2)
Creatinine, Ser: 1.13 mg/dL (ref 0.76–1.27)
GFR calc Af Amer: 94 mL/min/{1.73_m2} (ref >59)
GFR calc non Af Amer: 81 mL/min/{1.73_m2} (ref >59)
Globulin, Total: 3 g/dL (ref 1.5–4.5)
Glucose: 76 mg/dL (ref 65–99)
POTASSIUM: 4.5 mmol/L (ref 3.5–5.2)
Sodium: 140 mmol/L (ref 134–144)
Total Protein: 7.8 g/dL (ref 6.0–8.5)

## 2018-05-16 LAB — LIPID PANEL
Chol/HDL Ratio: 5.7 ratio — ABNORMAL HIGH (ref 0.0–5.0)
Cholesterol, Total: 261 mg/dL — ABNORMAL HIGH (ref 100–199)
HDL: 46 mg/dL (ref >39)
LDL Calculated: 170 mg/dL — ABNORMAL HIGH (ref 0–99)
TRIGLYCERIDES: 224 mg/dL — AB (ref 0–149)
VLDL Cholesterol Cal: 45 mg/dL — ABNORMAL HIGH (ref 5–40)

## 2018-05-16 LAB — CBC WITH DIFFERENTIAL/PLATELET
BASOS: 0 %
Basophils Absolute: 0 10*3/uL (ref 0.0–0.2)
EOS (ABSOLUTE): 0.3 10*3/uL (ref 0.0–0.4)
EOS: 5 %
HEMATOCRIT: 47.1 % (ref 37.5–51.0)
HEMOGLOBIN: 15.7 g/dL (ref 13.0–17.7)
IMMATURE GRANS (ABS): 0 10*3/uL (ref 0.0–0.1)
Immature Granulocytes: 0 %
LYMPHS ABS: 2.2 10*3/uL (ref 0.7–3.1)
LYMPHS: 37 %
MCH: 29 pg (ref 26.6–33.0)
MCHC: 33.3 g/dL (ref 31.5–35.7)
MCV: 87 fL (ref 79–97)
MONOCYTES: 11 %
Monocytes Absolute: 0.7 10*3/uL (ref 0.1–0.9)
Neutrophils Absolute: 2.7 10*3/uL (ref 1.4–7.0)
Neutrophils: 47 %
Platelets: 228 10*3/uL (ref 150–450)
RBC: 5.41 x10E6/uL (ref 4.14–5.80)
RDW: 14.4 % (ref 12.3–15.4)
WBC: 5.9 10*3/uL (ref 3.4–10.8)

## 2018-05-16 MED ORDER — OMEGA-3-ACID ETHYL ESTERS 1 G PO CAPS: 1.0000 g | ORAL_CAPSULE | Freq: Two times a day (BID) | ORAL | 3 refills | Status: DC

## 2018-05-16 MED ORDER — ATORVASTATIN CALCIUM 40 MG PO TABS: 40.0000 mg | ORAL_TABLET | Freq: Every day | ORAL | 3 refills | Status: DC

## 2018-05-16 NOTE — Telephone Encounter (Signed)
-----   Message from Clent Demark, PA-C sent at 05/16/2018 12:23 PM EDT ----- Elevated cholesterol. Rest of labs normal. I have sent two medications to [facility] at [facility] for his cholesterol.

## 2018-05-16 NOTE — Telephone Encounter (Signed)
Patient aware that cholesterol is elevated and two medications being sent to [facility] to help lower his cholesterol. Aware that all other labs are normal. Nat Christen, CMA

## 2019-05-12 ENCOUNTER — Ambulatory Visit (INDEPENDENT_AMBULATORY_CARE_PROVIDER_SITE_OTHER): Payer: Self-pay | Admitting: Primary Care

## 2019-05-12 ENCOUNTER — Other Ambulatory Visit: Payer: Self-pay

## 2019-05-12 ENCOUNTER — Encounter (INDEPENDENT_AMBULATORY_CARE_PROVIDER_SITE_OTHER): Payer: Self-pay | Admitting: Primary Care

## 2019-05-12 VITALS — BP 153/98 | HR 77 | Temp 98.1°F | Ht 66.5 in | Wt 201.8 lb

## 2019-05-12 DIAGNOSIS — I10 Essential (primary) hypertension: Secondary | ICD-10-CM

## 2019-05-12 DIAGNOSIS — E6609 Other obesity due to excess calories: Secondary | ICD-10-CM

## 2019-05-12 DIAGNOSIS — Z0000 Encounter for general adult medical examination without abnormal findings: Secondary | ICD-10-CM

## 2019-05-12 DIAGNOSIS — Z6832 Body mass index (BMI) 32.0-32.9, adult: Secondary | ICD-10-CM

## 2019-05-12 DIAGNOSIS — Z13220 Encounter for screening for lipoid disorders: Secondary | ICD-10-CM

## 2019-05-12 DIAGNOSIS — R7303 Prediabetes: Secondary | ICD-10-CM

## 2019-05-12 MED ORDER — AMLODIPINE BESYLATE 10 MG PO TABS: 10.0000 mg | ORAL_TABLET | Freq: Every day | ORAL | 0 refills | Status: DC

## 2019-05-12 NOTE — Patient Instructions (Signed)
 Health Maintenance, Male Adopting a healthy lifestyle and getting preventive care are important in promoting health and wellness. Ask your health care provider about:  The right schedule for you to have regular tests and exams.  Things you can do on your own to prevent diseases and keep yourself healthy. What should I know about diet, weight, and exercise? Eat a healthy diet   Eat a diet that includes plenty of vegetables, fruits, low-fat dairy products, and lean protein.  Do not eat a lot of foods that are high in solid fats, added sugars, or sodium. Maintain a healthy weight Body mass index (BMI) is a measurement that can be used to identify possible weight problems. It estimates body fat based on height and weight. Your health care provider can help determine your BMI and help you achieve or maintain a healthy weight. Get regular exercise Get regular exercise. This is one of the most important things you can do for your health. Most adults should:  Exercise for at least 150 minutes each week. The exercise should increase your heart rate and make you sweat (moderate-intensity exercise).  Do strengthening exercises at least twice a week. This is in addition to the moderate-intensity exercise.  Spend less time sitting. Even light physical activity can be beneficial. Watch cholesterol and blood lipids Have your blood tested for lipids and cholesterol at 41 years of age, then have this test every 5 years. You may need to have your cholesterol levels checked more often if:  Your lipid or cholesterol levels are high.  You are older than 40 years of age.  You are at high risk for heart disease. What should I know about cancer screening? Many types of cancers can be detected early and may often be prevented. Depending on your health history and family history, you may need to have cancer screening at various ages. This may include screening for:  Colorectal cancer.  Prostate  cancer.  Skin cancer.  Lung cancer. What should I know about heart disease, diabetes, and high blood pressure? Blood pressure and heart disease  High blood pressure causes heart disease and increases the risk of stroke. This is more likely to develop in people who have high blood pressure readings, are of African descent, or are overweight.  Talk with your health care provider about your target blood pressure readings.  Have your blood pressure checked: ? Every 3-5 years if you are 18-39 years of age. ? Every year if you are 40 years old or older.  If you are between the ages of 65 and 75 and are a current or former smoker, ask your health care provider if you should have a one-time screening for abdominal aortic aneurysm (AAA). Diabetes Have regular diabetes screenings. This checks your fasting blood sugar level. Have the screening done:  Once every three years after age 45 if you are at a normal weight and have a low risk for diabetes.  More often and at a younger age if you are overweight or have a high risk for diabetes. What should I know about preventing infection? Hepatitis B If you have a higher risk for hepatitis B, you should be screened for this virus. Talk with your health care provider to find out if you are at risk for hepatitis B infection. Hepatitis C Blood testing is recommended for:  Everyone born from 1945 through 1965.  Anyone with known risk factors for hepatitis C. Sexually transmitted infections (STIs)  You should be screened each   year for STIs, including gonorrhea and chlamydia, if: ? You are sexually active and are younger than 41 years of age. ? You are older than 41 years of age and your health care provider tells you that you are at risk for this type of infection. ? Your sexual activity has changed since you were last screened, and you are at increased risk for chlamydia or gonorrhea. Ask your health care provider if you are at risk.  Ask your  health care provider about whether you are at high risk for HIV. Your health care provider may recommend a prescription medicine to help prevent HIV infection. If you choose to take medicine to prevent HIV, you should first get tested for HIV. You should then be tested every 3 months for as long as you are taking the medicine. Follow these instructions at home: Lifestyle  Do not use any products that contain nicotine or tobacco, such as cigarettes, e-cigarettes, and chewing tobacco. If you need help quitting, ask your health care provider.  Do not use street drugs.  Do not share needles.  Ask your health care provider for help if you need support or information about quitting drugs. Alcohol use  Do not drink alcohol if your health care provider tells you not to drink.  If you drink alcohol: ? Limit how much you have to 0-2 drinks a [place]. ? Be aware of how much alcohol is in your drink. In the [place] one drink equals one 12 oz bottle of beer (355 mL), one 5 oz glass of wine (148 mL), or one 1 oz glass of hard liquor (44 mL). General instructions  Schedule regular health, dental, and eye exams.  Stay current with your vaccines.  Tell your health care provider if: ? You often feel depressed. ? You have ever been abused or do not feel safe at home. Summary  Adopting a healthy lifestyle and getting preventive care are important in promoting health and wellness.  Follow your health care provider's instructions about healthy diet, exercising, and getting tested or screened for diseases.  Follow your health care provider's instructions on monitoring your cholesterol and blood pressure. This information is not intended to replace advice given to you by your health care provider. Make sure you discuss any questions you have with your health care provider. Document Released: 04/12/2008 Document Revised: 10/08/2018 Document Reviewed: 10/08/2018 Elsevier Patient Education  2020 [place] and Cholesterol Restricted Eating Plan Getting too much fat and cholesterol in your diet may cause health problems. Choosing the right foods helps keep your fat and cholesterol at normal levels. This can keep you from getting certain diseases. Your doctor may recommend an eating plan that includes:  Total fat: ______% or less of total calories a [place].  Saturated fat: ______% or less of total calories a [place].  Cholesterol: less than _________mg a [place].  Fiber: ______g a [place]. What are tips for following this plan? Meal planning  At meals, divide your plate into four equal parts: ? Fill one-half of your plate with vegetables and green salads. ? Fill one-fourth of your plate with whole grains. ? Fill one-fourth of your plate with low-fat (lean) protein foods.  Eat fish that is high in omega-3 fats at least two times a week. This includes mackerel, tuna, sardines, and salmon.  Eat foods that are high in fiber, such as whole grains, beans, apples, broccoli, carrots, peas, and barley. General tips   Work with your doctor to  lose weight if you need to.  Avoid: ? Foods with added sugar. ? Fried foods. ? Foods with partially hydrogenated oils.  Limit alcohol intake to no more than 1 drink a [place]one[place] for nonpregnant women and 2 drinks a [place] for men. One drink equals 12 oz of beer, 5 oz of wine, or 1 oz of hard liquor. Reading food labels  Check food labels for: ? Trans fats. ? Partially hydrogenated oils. ? Saturated fat (g) in each serving. ? Cholesterol (mg) in each serving. ? Fiber (g) in each serving.  Choose foods with healthy fats, such as: ? Monounsaturated fats. ? Polyunsaturated fats. ? Omega-3 fats.  Choose grain products that have whole grains. Look for the word "whole" as the first word in the ingredient list. Cooking  Cook foods using low-fat methods. These include baking, boiling, grilling, and broiling.  Eat more home-cooked foods. Eat at restaurants and  buffets less often.  Avoid cooking using saturated fats, such as butter, cream, palm oil, palm kernel oil, and coconut oil. Recommended foods  Fruits  All fresh, canned (in natural juice), or frozen fruits. Vegetables  Fresh or frozen vegetables (raw, steamed, roasted, or grilled). Green salads. Grains  Whole grains, such as whole wheat or whole grain breads, crackers, cereals, and pasta. Unsweetened oatmeal, bulgur, barley, quinoa, or brown rice. Corn or whole wheat flour tortillas. Meats and other protein foods  Ground beef (85% or leaner), grass-fed beef, or beef trimmed of fat. Skinless chicken or [place]. Ground chicken or [place]. Pork trimmed of fat. All fish and seafood. Egg whites. Dried beans, peas, or lentils. Unsalted nuts or seeds. Unsalted canned beans. Nut butters without added sugar or oil. Dairy  Low-fat or nonfat dairy products, such as skim or 1% milk, 2% or reduced-fat cheeses, low-fat and fat-free ricotta or cottage cheese, or plain low-fat and nonfat yogurt. Fats and oils  Tub margarine without trans fats. Light or reduced-fat mayonnaise and salad dressings. Avocado. Olive, canola, sesame, or safflower oils. The items listed above may not be a complete list of foods and beverages you can eat. Contact a dietitian for more information. Foods to avoid Fruits  Canned fruit in heavy syrup. Fruit in cream or butter sauce. Fried fruit. Vegetables  Vegetables cooked in cheese, cream, or butter sauce. Fried vegetables. Grains  White bread. White pasta. White rice. Cornbread. Bagels, pastries, and croissants. Crackers and snack foods that contain trans fat and hydrogenated oils. Meats and other protein foods  Fatty cuts of meat. Ribs, chicken wings, bacon, sausage, bologna, salami, chitterlings, fatback, hot dogs, bratwurst, and packaged lunch meats. Liver and organ meats. Whole eggs and egg yolks. Chicken and [place] with skin. Fried meat. Dairy  Whole or 2% milk,  cream, half-and-half, and cream cheese. Whole milk cheeses. Whole-fat or sweetened yogurt. Full-fat cheeses. Nondairy creamers and whipped toppings. Processed cheese, cheese spreads, and cheese curds. Beverages  Alcohol. Sugar-sweetened drinks such as sodas, lemonade, and fruit drinks. Fats and oils  Butter, stick margarine, lard, shortening, ghee, or bacon fat. Coconut, palm kernel, and palm oils. Sweets and desserts  Corn syrup, sugars, honey, and molasses. Candy. Jam and jelly. Syrup. Sweetened cereals. Cookies, pies, cakes, donuts, muffins, and ice cream. The items listed above may not be a complete list of foods and beverages you should avoid. Contact a dietitian for more information. Summary  Choosing the right foods helps keep your fat and cholesterol at normal levels. This can keep you from getting certain diseases.  At meals,  fill one-half of your plate with vegetables and green salads.  Eat high-fiber foods, like whole grains, beans, apples, carrots, peas, and barley.  Limit added sugar, saturated fats, alcohol, and fried foods. This information is not intended to replace advice given to you by your health care provider. Make sure you discuss any questions you have with your health care provider. Document Released: 04/15/2012 Document Revised: 06/18/2018 Document Reviewed: 07/02/2017 Elsevier Patient Education  2020 [employer].

## 2019-05-12 NOTE — Progress Notes (Signed)
Established Patient Office Visit  Subjective:  Patient ID: Charles Shaffer, male    DOB: 09-20-1976  Age: 43 y.o. MRN: [id number]  CC:  Chief Complaint  Patient presents with  . Establish Care    with new NP  . Annual Exam    PSA    HPI Charles Shaffer presents for routine follow up his Bp is elevated he denies shortness of breath, headaches, chest pain or lower extremity edema. He has stopped taking cholesterol medication we will check lipids to determine if he needs medication   Past Medical History:  Diagnosis Date  . High cholesterol   . Hypertension     History reviewed. No pertinent surgical history.  Family History  Problem Relation Age of Onset  . Stroke Brother 55  . Diabetes Brother   . Diabetes Father 28  . CAD Father 68  . Diabetes Mother   . Diabetes Maternal Aunt     Social History   Socioeconomic History  . Marital status: Married    Spouse name: Not on file  . Number of children: Not on file  . Years of education: Not on file  . Highest education level: Not on file  Occupational History  . Not on file  Social Needs  . Financial resource strain: Not on file  . Food insecurity    Worry: Not on file    Inability: Not on file  . Transportation needs    Medical: Not on file    Non-medical: Not on file  Tobacco Use  . Smoking status: Never Smoker  . Smokeless tobacco: Never Used  Substance and Sexual Activity  . Alcohol use: No    Alcohol/week: 0.0 standard drinks  . Drug use: No  . Sexual activity: Not on file  Lifestyle  . Physical activity    Days per week: Not on file    Minutes per session: Not on file  . Stress: Not on file  Relationships  . Social Herbalist on phone: Not on file    Gets together: Not on file    Attends religious service: Not on file    Active member of club or organization: Not on file    Attends meetings of clubs or organizations: Not on file    Relationship status: Not on file  . Intimate partner violence     Fear of current or ex partner: Not on file    Emotionally abused: Not on file    Physically abused: Not on file    Forced sexual activity: Not on file  Other Topics Concern  . Not on file  Social History Narrative  . Not on file    Outpatient Medications Prior to Visit  Medication Sig Dispense Refill  . atorvastatin (LIPITOR) 40 MG tablet Take 1 tablet (40 mg total) by mouth daily. (Patient not taking: Reported on 05/12/2019) 90 tablet 3  . omega-3 acid ethyl esters (LOVAZA) 1 g capsule Take 1 capsule (1 g total) by mouth 2 (two) times daily. (Patient not taking: Reported on 05/12/2019) 180 capsule 3  . cyclobenzaprine (FLEXERIL) 10 MG tablet Take 1 tablet (10 mg total) by mouth 3 (three) times daily as needed for muscle spasms. 10 tablet 1   No facility-administered medications prior to visit.     Allergies  Allergen Reactions  . Cashew Nut Oil Other (See Comments)    Numbness on tongue and mouth    ROS Review of Systems  All other systems reviewed  and are negative.     Objective:    Physical Exam  Constitutional: He appears well-developed and well-nourished.  HENT:  Head: Normocephalic.  Eyes: EOM are normal.  Neck: Normal range of motion. Neck supple.  Cardiovascular: Normal rate and regular rhythm.  Pulmonary/Chest: Effort normal and breath sounds normal.  Abdominal: Soft. Bowel sounds are normal. He exhibits distension.  Musculoskeletal: Normal range of motion.  Skin: Skin is warm and dry.  Psychiatric: He has a normal mood and affect.    BP (!) 153/98 (BP Location: Left Arm, Patient Position: Sitting, Cuff Size: Normal)   Pulse 77   Temp 98.1 F (36.7 C) (Tympanic)   Ht 5' 6.5" (1.689 m)   Wt 201 lb 12.8 oz (91.5 kg)   SpO2 95%   BMI 32.08 kg/m  Wt Readings from Last 3 Encounters:  05/12/19 201 lb 12.8 oz (91.5 kg)  05/15/18 204 lb (92.5 kg)  02/06/16 179 lb 8 oz (81.4 kg)     There are no preventive care reminders to display for this  patient.  There are no preventive care reminders to display for this patient.  No results found for: TSH Lab Results  Component Value Date   WBC 5.9 05/15/2018   HGB 15.7 05/15/2018   HCT 47.1 05/15/2018   MCV 87 05/15/2018   PLT 228 05/15/2018   Lab Results  Component Value Date   NA 140 05/15/2018   K 4.5 05/15/2018   CO2 25 05/15/2018   GLUCOSE 76 05/15/2018   BUN 13 05/15/2018   CREATININE 1.13 05/15/2018   BILITOT 0.5 05/15/2018   ALKPHOS 58 05/15/2018   AST 29 05/15/2018   ALT 38 05/15/2018   PROT 7.8 05/15/2018   ALBUMIN 4.8 05/15/2018   CALCIUM 9.9 05/15/2018   ANIONGAP 10 02/06/2016   Lab Results  Component Value Date   CHOL 261 (H) 05/15/2018   Lab Results  Component Value Date   HDL 46 05/15/2018   Lab Results  Component Value Date   LDLCALC 170 (H) 05/15/2018   Lab Results  Component Value Date   TRIG 224 (H) 05/15/2018   Lab Results  Component Value Date   CHOLHDL 5.7 (H) 05/15/2018   Lab Results  Component Value Date   HGBA1C 5.7 (A) 05/15/2018      Assessment & Plan:   Problem List Items Addressed This Visit    None    Visit Diagnoses    Class 1 obesity due to excess calories with serious comorbidity and body mass index (BMI) of 32.0 to 32.9 in adult    -  Primary   Relevant Orders   Comprehensive metabolic panel   Prediabetes       Relevant Orders   CBC with Differential   Essential hypertension       Relevant Medications   amLODipine (NORVASC) 10 MG tablet   Other Relevant Orders   Comprehensive metabolic panel   Screening for lipoid disorders       Relevant Orders   Lipid panel   Body mass index (BMI) of 32.0-32.9 in adult       Relevant Orders   Comprehensive metabolic panel    .med  Meds ordered this encounter  Medications  . amLODipine (NORVASC) 10 MG tablet    Sig: Take 1 tablet (10 mg total) by mouth daily.    Dispense:  90 tablet    Refill:  0   Antoine was seen today for establish care and annual  exam.  Diagnoses and all orders for this visit:  Class 1 obesity due to excess calories with serious comorbidity and body mass index (BMI) of 32.0 to 32.9 in adult Recommend excising at least 30 mins a Shelley (vigorious ) and reduction in caloric intake -     Comprehensive metabolic panel  Prediabetes -     CBC with Differential  Essential hypertension Counseled on blood pressure goal of less than 130/80, low-sodium, DASH diet, medication compliance, 150 minutes of moderate intensity exercise per week. Discussed medication compliance, adverse effects. -     Comprehensive metabolic panel  Screening for lipoid disorders  Healthy lifestyle diet of fruits vegetables fish nuts whole grains and low saturated fat . Foods high in cholesterol or liver, fatty meats,cheese, butter avocados, nuts and seeds, chocolate and fried foods. -     Lipid panel  Annual physical exam -     PSA  Other orders -     amLODipine (NORVASC) 10 MG tablet; Take 1 tablet (10 mg total) by mouth daily.   Follow-up: Return in 4 weeks (on 06/09/2019) for blood pressure .    Kerin Perna, NP

## 2019-05-13 LAB — PSA: Prostate Specific Ag, Serum: 1 ng/mL (ref 0.0–4.0)

## 2019-05-14 ENCOUNTER — Telehealth (INDEPENDENT_AMBULATORY_CARE_PROVIDER_SITE_OTHER): Payer: Self-pay

## 2019-05-14 NOTE — Telephone Encounter (Signed)
Patient verified date of birth and was informed that PSA was normal. Nat Christen, CMA

## 2019-05-14 NOTE — Telephone Encounter (Signed)
-----   Message from Kerin Perna, NP sent at 05/13/2019  5:13 PM EDT ----- Prostrate test normal

## 2019-06-09 ENCOUNTER — Ambulatory Visit (INDEPENDENT_AMBULATORY_CARE_PROVIDER_SITE_OTHER): Payer: Medicaid Other | Admitting: Primary Care

## 2019-07-28 ENCOUNTER — Ambulatory Visit (INDEPENDENT_AMBULATORY_CARE_PROVIDER_SITE_OTHER): Payer: Self-pay | Admitting: Primary Care

## 2019-07-28 ENCOUNTER — Other Ambulatory Visit: Payer: Self-pay

## 2019-07-28 ENCOUNTER — Encounter (INDEPENDENT_AMBULATORY_CARE_PROVIDER_SITE_OTHER): Payer: Self-pay | Admitting: Primary Care

## 2019-07-28 VITALS — Ht 66.0 in | Wt 206.0 lb

## 2019-07-28 DIAGNOSIS — S46812A Strain of other muscles, fascia and tendons at shoulder and upper arm level, left arm, initial encounter: Secondary | ICD-10-CM

## 2019-07-28 DIAGNOSIS — S46811A Strain of other muscles, fascia and tendons at shoulder and upper arm level, right arm, initial encounter: Secondary | ICD-10-CM

## 2019-07-28 DIAGNOSIS — T148XXA Other injury of unspecified body region, initial encounter: Secondary | ICD-10-CM

## 2019-07-28 DIAGNOSIS — E6609 Other obesity due to excess calories: Secondary | ICD-10-CM

## 2019-07-28 DIAGNOSIS — I10 Essential (primary) hypertension: Secondary | ICD-10-CM

## 2019-07-28 DIAGNOSIS — D1724 Benign lipomatous neoplasm of skin and subcutaneous tissue of left leg: Secondary | ICD-10-CM

## 2019-07-28 DIAGNOSIS — Z6832 Body mass index (BMI) 32.0-32.9, adult: Secondary | ICD-10-CM

## 2019-07-28 MED ORDER — CYCLOBENZAPRINE HCL 10 MG PO TABS: 10.0000 mg | ORAL_TABLET | Freq: Three times a day (TID) | ORAL | 1 refills | Status: DC | PRN

## 2019-07-28 MED ORDER — IBUPROFEN 600 MG PO TABS: 600.0000 mg | ORAL_TABLET | Freq: Three times a day (TID) | ORAL | 1 refills | Status: DC | PRN

## 2019-07-28 NOTE — Progress Notes (Signed)
Established Patient Office Visit  Subjective:  Patient ID: Charles Shaffer, male    DOB: [birth year]  Age: 43 y.o. MRN: [id number]  CC:  Chief Complaint  Patient presents with  . Back Pain    HPI Charles Shaffer presents for complaints of his shoulder ,arms and back contently are hurting and voiced concern of a knot on his left leg. 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 Will evaluate knott.  Past Medical History:  Diagnosis Date  . High cholesterol   . Hypertension     History reviewed. No pertinent surgical history.  Family History  Problem Relation Age of Onset  . Stroke Brother 36  . Diabetes Brother   . Diabetes Father 80  . CAD Father 39  . Diabetes Mother   . Diabetes Maternal Aunt     Social History   Socioeconomic History  . Marital status: Married    Spouse name: Not on file  . Number of children: Not on file  . Years of education: Not on file  . Highest education level: Not on file  Occupational History  . Not on file  Social Needs  . Financial resource strain: Not on file  . Food insecurity    Worry: Not on file    Inability: Not on file  . Transportation needs    Medical: Not on file    Non-medical: Not on file  Tobacco Use  . Smoking status: Never Smoker  . Smokeless tobacco: Never Used  Substance and Sexual Activity  . Alcohol use: Yes    Alcohol/week: 0.0 standard drinks    Comment: occ  . Drug use: No  . Sexual activity: Yes  Lifestyle  . Physical activity    Days per week: Not on file    Minutes per session: Not on file  . Stress: Not on file  Relationships  . Social Herbalist on phone: Not on file    Gets together: Not on file    Attends religious service: Not on file    Active member of club or organization: Not on file    Attends meetings of clubs or organizations: Not on file    Relationship status: Not on file  . Intimate partner violence    Fear of current or ex partner: Not on  file    Emotionally abused: Not on file    Physically abused: Not on file    Forced sexual activity: Not on file  Other Topics Concern  . Not on file  Social History Narrative  . Not on file    Outpatient Medications Prior to Visit  Medication Sig Dispense Refill  . amLODipine (NORVASC) 10 MG tablet Take 1 tablet (10 mg total) by mouth daily. (Patient not taking: Reported on 07/28/2019) 90 tablet 0  . atorvastatin (LIPITOR) 40 MG tablet Take 1 tablet (40 mg total) by mouth daily. (Patient not taking: Reported on 05/12/2019) 90 tablet 3  . omega-3 acid ethyl esters (LOVAZA) 1 g capsule Take 1 capsule (1 g total) by mouth 2 (two) times daily. (Patient not taking: Reported on 07/28/2019) 180 capsule 3   No facility-administered medications prior to visit.     Allergies  Allergen Reactions  . Cashew Nut Oil Other (See Comments)    Numbness on tongue and mouth    ROS Review of Systems  Musculoskeletal: Positive for back pain.       Shoulder,  arms and lower back  Skin:       knott  Neurological: Positive for weakness.  All other systems reviewed and are negative.     Objective:    Physical Exam  Constitutional: He appears well-developed and well-nourished.  HENT:  Head: Normocephalic.  Neck: Normal range of motion. Neck supple.  Cardiovascular: Normal rate and regular rhythm.  Pulmonary/Chest: Effort normal and breath sounds normal.  Abdominal: Soft. Bowel sounds are normal. He exhibits distension.  Musculoskeletal: Normal range of motion.  Neurological: He is alert.  Skin: Skin is warm.  lipoma  Psychiatric: He has a normal mood and affect.    Ht 5\' 6"  (1.676 m)   Wt 206 lb (93.4 kg)   BMI 33.25 kg/m  Wt Readings from Last 3 Encounters:  07/28/19 206 lb (93.4 kg)  05/12/19 201 lb 12.8 oz (91.5 kg)  05/15/18 204 lb (92.5 kg)     Health Maintenance Due  Topic Date Due  . INFLUENZA VACCINE  05/30/2019    There are no preventive care reminders to display for  this patient.  No results found for: TSH Lab Results  Component Value Date   WBC 5.9 05/15/2018   HGB 15.7 05/15/2018   HCT 47.1 05/15/2018   MCV 87 05/15/2018   PLT 228 05/15/2018   Lab Results  Component Value Date   NA 140 05/15/2018   K 4.5 05/15/2018   CO2 25 05/15/2018   GLUCOSE 76 05/15/2018   BUN 13 05/15/2018   CREATININE 1.13 05/15/2018   BILITOT 0.5 05/15/2018   ALKPHOS 58 05/15/2018   AST 29 05/15/2018   ALT 38 05/15/2018   PROT 7.8 05/15/2018   ALBUMIN 4.8 05/15/2018   CALCIUM 9.9 05/15/2018   ANIONGAP 10 02/06/2016   Lab Results  Component Value Date   CHOL 261 (H) 05/15/2018   Lab Results  Component Value Date   HDL 46 05/15/2018   Lab Results  Component Value Date   LDLCALC 170 (H) 05/15/2018   Lab Results  Component Value Date   TRIG 224 (H) 05/15/2018   Lab Results  Component Value Date   CHOLHDL 5.7 (H) 05/15/2018   Lab Results  Component Value Date   HGBA1C 5.7 (A) 05/15/2018      Assessment & Plan:  Charles Shaffer was seen today for back pain.  Diagnoses and all orders for this visit:  Essential hypertension  low-sodium, DASH diet, medication compliance, 150 minutes of moderate intensity exercise per week. Discussed medication compliance he has chosen not to take heard it affects other part of the body  Class 1 obesity due to excess calories with serious comorbidity and body mass index (BMI) of 32.0 to 32.9 in adult Discussed increase risk for heart attack and stroke need to exercise and choose a healthier lifestyle. Decrease your fatty foods, red meat, cheese, milk and increase fiber like whole grains and veggies.   Muscle strain Probably related to repetative motion will treat with ibuprofen and flexeril ( trapezius muscle tight and firm pain with palpation. -     ibuprofen (ADVIL) 600 MG tablet; Take 1 tablet (600 mg total) by mouth every 8 (eight) hours as needed. -     cyclobenzaprine (FLEXERIL) 10 MG tablet; Take 1 tablet (10 mg  total) by mouth 3 (three) times daily as needed for muscle spasms.  Lipoma of left lower extremity   Meds ordered this encounter  Medications  . ibuprofen (ADVIL) 600 MG tablet    Sig: Take 1  tablet (600 mg total) by mouth every 8 (eight) hours as needed.    Dispense:  90 tablet    Refill:  1  . cyclobenzaprine (FLEXERIL) 10 MG tablet    Sig: Take 1 tablet (10 mg total) by mouth 3 (three) times daily as needed for muscle spasms.    Dispense:  60 tablet    Refill:  1    Follow-up: Return if symptoms worsen or fail to improve, for sent in ibuprofen and muscle spasm.    Kerin Perna, NP

## 2019-07-28 NOTE — Patient Instructions (Signed)
Acute Back Pain, Adult Acute back pain is sudden and usually short-lived. It is often caused by an injury to the muscles and tissues in the back. The injury may result from:  A muscle or ligament getting overstretched or torn (strained). Ligaments are tissues that connect bones to each other. Lifting something improperly can cause a back strain.  Wear and tear (degeneration) of the spinal disks. Spinal disks are circular tissue that provides cushioning between the bones of the spine (vertebrae).  Twisting motions, such as while playing sports or doing yard work.  A hit to the back.  Arthritis. You may have a physical exam, lab tests, and imaging tests to find the cause of your pain. Acute back pain usually goes away with rest and home care. Follow these instructions at home: Managing pain, stiffness, and swelling  Take over-the-counter and prescription medicines only as told by your health care provider.  Your health care provider may recommend applying ice during the first 24-48 hours after your pain starts. To do this: ? Put ice in a plastic bag. ? Place a towel between your skin and the bag. ? Leave the ice on for 20 minutes, 2-3 times a [place].  If directed, apply heat to the affected area as often as told by your health care provider. Use the heat source that your health care provider recommends, such as a moist heat pack or a heating pad. ? Place a towel between your skin and the heat source. ? Leave the heat on for 20-30 minutes. ? Remove the heat if your skin turns bright red. This is especially important if you are unable to feel pain, heat, or cold. You have a greater risk of getting burned. Activity   Do not stay in bed. Staying in bed for more than 1-2 days can delay your recovery.  Sit up and stand up straight. Avoid leaning forward when you sit, or hunching over when you stand. ? If you work at a desk, sit close to it so you do not need to lean over. Keep your chin tucked  in. Keep your neck drawn back, and keep your elbows bent at a right angle. Your arms should look like the letter "L." ? Sit high and close to the steering wheel when you drive. Add lower back (lumbar) support to your car seat, if needed.  Take short walks on even surfaces as soon as you are able. Try to increase the length of time you walk each [place].  Do not sit, drive, or stand in one place for more than 30 minutes at a time. Sitting or standing for long periods of time can put stress on your back.  Do not drive or use heavy machinery while taking prescription pain medicine.  Use proper lifting techniques. When you bend and lift, use positions that put less stress on your back: ? Bend your knees. ? Keep the load close to your body. ? Avoid twisting.  Exercise regularly as told by your health care provider. Exercising helps your back heal faster and helps prevent back injuries by keeping muscles strong and flexible.  Work with a physical therapist to make a safe exercise program, as recommended by your health care provider. Do any exercises as told by your physical therapist. Lifestyle  Maintain a healthy weight. Extra weight puts stress on your back and makes it difficult to have good posture.  Avoid activities or situations that make you feel anxious or stressed. Stress and anxiety increase muscle   tension and can make back pain worse. Learn ways to manage anxiety and stress, such as through exercise. General instructions  Sleep on a firm mattress in a comfortable position. Try lying on your side with your knees slightly bent. If you lie on your back, put a pillow under your knees.  Follow your treatment plan as told by your health care provider. This may include: ? Cognitive or behavioral therapy. ? Acupuncture or massage therapy. ? Meditation or yoga. Contact a health care provider if:  You have pain that is not relieved with rest or medicine.  You have increasing pain going down  into your legs or buttocks.  Your pain does not improve after 2 weeks.  You have pain at night.  You lose weight without trying.  You have a fever or chills. Get help right away if:  You develop new bowel or bladder control problems.  You have unusual weakness or numbness in your arms or legs.  You develop nausea or vomiting.  You develop abdominal pain.  You feel faint. Summary  Acute back pain is sudden and usually short-lived.  Use proper lifting techniques. When you bend and lift, use positions that put less stress on your back.  Take over-the-counter and prescription medicines and apply heat or ice as directed by your health care provider. This information is not intended to replace advice given to you by your health care provider. Make sure you discuss any questions you have with your health care provider. Document Released: 10/15/2005 Document Revised: 02/03/2019 Document Reviewed: 05/29/2017 Elsevier Patient Education  2020 [employer] Inc[employer]  

## 2021-09-05 ENCOUNTER — Ambulatory Visit (INDEPENDENT_AMBULATORY_CARE_PROVIDER_SITE_OTHER): Payer: 59 | Admitting: Primary Care

## 2021-09-05 ENCOUNTER — Other Ambulatory Visit: Payer: Self-pay

## 2021-09-05 ENCOUNTER — Encounter (INDEPENDENT_AMBULATORY_CARE_PROVIDER_SITE_OTHER): Payer: Self-pay | Admitting: Primary Care

## 2021-09-05 VITALS — BP 135/93 | HR 88 | Temp 97.5°F | Ht 66.0 in | Wt 213.0 lb

## 2021-09-05 DIAGNOSIS — R030 Elevated blood-pressure reading, without diagnosis of hypertension: Secondary | ICD-10-CM | POA: Diagnosis not present

## 2021-09-05 DIAGNOSIS — B379 Candidiasis, unspecified: Secondary | ICD-10-CM | POA: Diagnosis not present

## 2021-09-05 DIAGNOSIS — E782 Mixed hyperlipidemia: Secondary | ICD-10-CM

## 2021-09-05 DIAGNOSIS — Z23 Encounter for immunization: Secondary | ICD-10-CM

## 2021-09-05 DIAGNOSIS — Z131 Encounter for screening for diabetes mellitus: Secondary | ICD-10-CM | POA: Diagnosis not present

## 2021-09-05 DIAGNOSIS — Z0000 Encounter for general adult medical examination without abnormal findings: Secondary | ICD-10-CM

## 2021-09-05 DIAGNOSIS — Z6834 Body mass index (BMI) 34.0-34.9, adult: Secondary | ICD-10-CM

## 2021-09-05 LAB — POCT GLYCOSYLATED HEMOGLOBIN (HGB A1C): Hemoglobin A1C: 5.9 % — AB (ref 4.0–5.6)

## 2021-09-05 MED ORDER — CLOTRIMAZOLE 1 % EX SOLN: 1.0000 "application " | Freq: Two times a day (BID) | CUTANEOUS | 1 refills | Status: DC

## 2021-09-05 NOTE — Progress Notes (Signed)
Charles Shaffer is a 45 y.o. male presents to office today for annual physical exam examination.  Only voices concern underneath abdomin- rash  Concerns today include: 1. Rash on abdomen  Occupation: Art gallery manager , Marital status: S, Substance use: N/A Diet: regular , Exercise: yes Last eye exam: None Last dental exam: None Refills needed today: None Immunizations needed: Flu Vaccine: yes/decline Tdap Vaccine: yes  - every 21yrs - (<3 lifetime doses or unknown): all wounds -- look up need for Tetanus IG - (>=3 lifetime doses): clean/minor wound if >60yrs from previous; all other wounds if >26yrs from previous Zoster Vaccine: no (those >50yo, once) Pneumonia Vaccine: no (those w/ risk factors) - (<58yr) Both: Immunocompromised, cochlear implant, CSF leak, asplenic, sickle cell, Chronic Renal Failure - (<19yr) PPSV-23 only: Heart dz, lung disease, DM, tobacco abuse, alcoholism, cirrhosis/liver disease. - (>67yr): PPSV13 then PPSV23 in 6-12mths;  - (>29yr): repeat PPSV23 once if pt received prior to 43yo and 75yrs have passed  Past Medical History:  Diagnosis Date   High cholesterol    Hypertension    Social History   Socioeconomic History   Marital status: Married    Spouse name: Not on file   Number of children: Not on file   Years of education: Not on file   Highest education level: Not on file  Occupational History   Not on file  Tobacco Use   Smoking status: Never   Smokeless tobacco: Never  Substance and Sexual Activity   Alcohol use: Yes    Alcohol/week: 0.0 standard drinks    Comment: occ   Drug use: No   Sexual activity: Yes  Other Topics Concern   Not on file  Social History Narrative   Not on file   Social Determinants of Health   Financial Resource Strain: Not on file  Food Insecurity: Not on file  Transportation Needs: Not on file  Physical Activity: Not on file  Stress: Not on file  Social Connections: Not on file  Intimate Partner Violence: Not on file    No past surgical history on file. Family History  Problem Relation Age of Onset   Stroke Brother 26   Diabetes Brother    Diabetes Father 52   CAD Father 12   Diabetes Mother    Diabetes Maternal Aunt    No current outpatient medications on file.  Allergies  Allergen Reactions   Cashew Nut Oil Other (See Comments)    Numbness on tongue and mouth     ROS: Review of Systems Pertinent items noted in HPI and remainder of comprehensive ROS otherwise negative.    Physical exam BP (!) 135/93 (BP Location: Right Arm, Patient Position: Sitting, Cuff Size: Large)   Pulse 88   Temp (!) 97.5 F (36.4 C) (Temporal)   Ht 5\' 6"  (1.676 m)   Wt 213 lb (96.6 kg)   SpO2 94%   BMI 34.38 kg/m  Past Medical History:  Diagnosis Date   High cholesterol    Hypertension     No current outpatient medications on file prior to visit.   No current facility-administered medications on file prior to visit.     Objective:   Vitals:   09/05/21 1008  BP: (!) 135/93  Pulse: 88  Temp: (!) 97.5 F (36.4 C)  TempSrc: Temporal  SpO2: 94%  Weight: 213 lb (96.6 kg)  Height: 5\' 6"  (1.676 m)    Exam General appearance : Awake, alert, not in any distress. Speech Clear. Not toxic  looking HEENT: Atraumatic and Normocephalic, pupils equally reactive to light and accomodation Neck: Supple, no JVD. No cervical lymphadenopathy.  Chest: Good air entry bilaterally, no added sounds  CVS: S1 S2 regular, no murmurs.  Abdomen: Bowel sounds present, Non tender and not distended with no gaurding, rigidity or rebound. Extremities: B/L Lower Ext shows no edema, both legs are warm to touch Neurology: Awake alert, and oriented X 3, Non focal Skin: No Rash   Assessment & Plan   1. Need for Tdap vaccination - Tdap vaccine greater than or equal to 7yo IM  2. Screening for diabetes mellitus - HgB A1c 5.9 prediabetic - 5.7-6.4. ADA guidelines Monitor foods that are high in carbohydrates are the following  rice, potatoes, breads, sugars, and pastas.  Reduction in the intake (eating) will assist in lowering your blood sugars.   3. Class 2 severe obesity due to excess calories with serious comorbidity in adult, unspecified BMI ([place]) Obesity is 30-39 indicating an excess in caloric intake or underlining conditions. This may lead to other co-morbidities. Lifestyle modifications of diet and exercise may reduce obesity.    4. Elevated BP without diagnosis of hypertension Counseled on blood pressure goal of less than 130/80, low-sodium, DASH diet, medication compliance, 150 minutes of moderate intensity exercise per week.    The patient was given clear instructions to go to ER or return to medical center if symptoms don't improve, worsen or new problems develop. The patient verbalized understanding. The patient was told to call to get lab results if they haven't heard anything in the next week.   This note has been created with Surveyor, quantity. Any transcriptional errors are unintentional.    Counseled on healthy lifestyle choices, including diet (rich in fruits, vegetables and lean meats and low in salt and simple carbohydrates) and exercise (at least 30 minutes of moderate physical activity daily).  Patient to follow up in 1 year for annual exam or sooner if needed.  The above assessment and management plan was discussed with the patient. The patient verbalized understanding of and has agreed to the management plan. Patient is aware to call the clinic if symptoms persist or worsen. Patient is aware when to return to the clinic for a follow-up visit. Patient educated on when it is appropriate to go to the emergency department.   Juluis Mire NP-C [address] [place] [phone]

## 2021-09-05 NOTE — Patient Instructions (Addendum)
Tdap (Tetanus, Diphtheria, Pertussis) Vaccine: What You Need to Know 1. Why get vaccinated? Tdap vaccine can prevent tetanus, diphtheria, and pertussis. Diphtheria and pertussis spread from person to person. Tetanus enters the body through cuts or wounds. TETANUS (T) causes painful stiffening of the muscles. Tetanus can lead to serious health problems, including being unable to open the mouth, having trouble swallowing and breathing, or death. DIPHTHERIA (D) can lead to difficulty breathing, heart failure, paralysis, or death. PERTUSSIS (aP), also known as "whooping cough," can cause uncontrollable, violent coughing that makes it hard to breathe, eat, or drink. Pertussis can be extremely serious especially in babies and young children, causing pneumonia, convulsions, brain damage, or death. In teens and adults, it can cause weight loss, loss of bladder control, passing out, and rib fractures from severe coughing. 2. Tdap vaccine Tdap is only for children 7 years and older, adolescents, and adults.  Adolescents should receive a single dose of Tdap, preferably at age 10 or 71 years. Pregnant people should get a dose of Tdap during every pregnancy, preferably during the early part of the third trimester, to help protect the newborn from pertussis. Infants are most at risk for severe, life-threatening complications from pertussis. Adults who have never received Tdap should get a dose of Tdap. Also, adults should receive a booster dose of either Tdap or Td (a different vaccine that protects against tetanus and diphtheria but not pertussis) every 10 years, or after 5 years in the case of a severe or dirty wound or burn. Tdap may be given at the same time as other vaccines. 3. Talk with your health care provider Tell your vaccine provider if the person getting the vaccine: Has had an allergic reaction after a previous dose of any vaccine that protects against tetanus, diphtheria, or pertussis, or has any  severe, life-threatening allergies Has had a coma, decreased level of consciousness, or prolonged seizures within 7 days after a previous dose of any pertussis vaccine (DTP, DTaP, or Tdap) Has seizures or another nervous system problem Has ever had Guillain-Barr Syndrome (also called "GBS") Has had severe pain or swelling after a previous dose of any vaccine that protects against tetanus or diphtheria In some cases, your health care provider may decide to postpone Tdap vaccination until a future visit. People with minor illnesses, such as a cold, may be vaccinated. People who are moderately or severely ill should usually wait until they recover before getting Tdap vaccine.  Your health care provider can give you more information. 4. Risks of a vaccine reaction Pain, redness, or swelling where the shot was given, mild fever, headache, feeling tired, and nausea, vomiting, diarrhea, or stomachache sometimes happen after Tdap vaccination. People sometimes faint after medical procedures, including vaccination. Tell your provider if you feel dizzy or have vision changes or ringing in the ears.  As with any medicine, there is a very remote chance of a vaccine causing a severe allergic reaction, other serious injury, or death. 5. What if there is a serious problem? An allergic reaction could occur after the vaccinated person leaves the clinic. If you see signs of a severe allergic reaction (hives, swelling of the face and throat, difficulty breathing, a fast heartbeat, dizziness, or weakness), call 9-1-1 and get the person to the nearest hospital. For other signs that concern you, call your health care provider.  Adverse reactions should be reported to the Vaccine Adverse Event Reporting System (VAERS). Your health care provider will usually file this report, or you  can do it yourself. Visit the VAERS website at www.vaers.SamedayNews.es or call 707-641-1491. VAERS is only for reporting reactions, and VAERS staff  members do not give medical advice. 6. The National Vaccine Injury Compensation Program The [employer] Vaccine Injury Compensation Program (VICP) is a federal program that was created to compensate people who may have been injured by certain vaccines. Claims regarding alleged injury or death due to vaccination have a time limit for filing, which may be as short as two years. Visit the VICP website at GoldCloset.com.ee or call 8576754198 to learn about the program and about filing a claim. 7. How can I learn more? Ask your health care provider. Call your local or state health department. Visit the website of the Food and Drug Administration (FDA) for vaccine package inserts and additional information at TraderRating.uy. Contact the Centers for Disease Control and Prevention (CDC): Call 571-190-8476 (1-800-CDC-INFO) or Visit CDC's website at http://hunter.com/. Vaccine Information Statement Tdap (Tetanus, Diphtheria, Pertussis) Vaccine (06/03/2020) This information is not intended to replace advice given to you by your health care provider. Make sure you discuss any questions you have with your health care provider. Document Revised: 06/29/2020 Document Reviewed: 06/29/2020 Elsevier Patient Education  2022 [place]. Preventing Hypertension Hypertension, also called high blood pressure, is when the force of blood pumping through the arteries is too strong. Arteries are blood vessels that carry blood from the heart throughout the body. Often, hypertension does not cause symptoms until blood pressure is very high. It is important to have your blood pressure checked regularly. Diet and lifestyle changes can help you prevent hypertension, and they may make you feel better overall and improve your quality of life. If you already have hypertension, you may control it with diet and lifestyle changes, as well as with medicine. How can this condition  affect me? Over time, hypertension can damage the arteries and decrease blood flow to important parts of the body, including the brain, heart, and kidneys. By keeping your blood pressure in a healthy range, you can help prevent complications like heart attack, heart failure, stroke, kidney failure, and vascular dementia. What can increase my risk? Being an older adult. Older people are more often affected. Having family members who have had high blood pressure. Being obese. Being male. Males are more likely to have high blood pressure. Drinking too much alcohol or caffeine. Smoking or using illegal drugs. Taking certain medicines, such as antidepressants, decongestants, birth control pills, and NSAIDs, such as ibuprofen. Having thyroid problems. Having certain tumors. What actions can I take to prevent or manage this condition? Work with your health care provider to make a hypertension prevention plan that works for you. Follow your plan and keep all follow-up visits as told by your health care provider. Diet changes Maintain a healthy diet. This includes: Eating less salt (sodium). Ask your health care provider how much sodium is safe for you to have. The general recommendation is to have less than 1 tsp (2,300 mg) of sodium a [place]. Do not add salt to your food. Choose low-sodium options when grocery shopping and eating out. Limiting fats in your diet. You can do this by eating low-fat or fat-free dairy products and by eating less red meat. Eating more fruits, vegetables, and whole grains. Make a goal to eat: 1-2 cups of fresh fruits and vegetables each [place]. 3-4 servings of whole grains each [place]. Avoiding foods and beverages that have added sugars. Eating fish that contain healthy fats (omega-3 fatty acids), such as  mackerel or salmon. If you need help putting together a healthy eating plan, try the DASH diet. This diet is high in fruits, vegetables, and whole grains. It is low in sodium,  red meat, and added sugars. DASH stands for Dietary Approaches to Stop Hypertension. Lifestyle changes Lose weight if you are overweight. Losing just 3?5% of your body weight can help prevent or control hypertension. For example, if your present weight is 200 lb (91 kg), a loss of 3-5% of your weight means losing 6-10 lb (2.7-4.5 kg). Ask your health care provider to help you with a diet and exercise plan to safely lose weight. Other recommendations usually include: Get enough exercise. Do at least 150 minutes of moderate-intensity exercise each week. You could do this in short exercise sessions several times a [place]berg, or you could do longer exercise sessions a few times a week. For example, you could take a brisk 10-minute walk or bike ride, 3 times a [place], for 5 days a week. Find ways to reduce stress, such as exercising, meditating, listening to music, or taking a yoga class. If you need help reducing stress, ask your health care provider. Do not use any products that contain nicotine or tobacco, such as cigarettes, e-cigarettes, and chewing tobacco. If you need help quitting, ask your health care provider. Chemicals in tobacco and nicotine products raise your blood pressure each time you use them. If you need help quitting, ask your health care provider. Learn how to check your blood pressure at home. Make sure that you know your personal target blood pressure, as told by your health care provider. Try to sleep 7-9 hours per night.  Alcohol use Do not drink alcohol if: Your health care provider tells you not to drink. You are pregnant, may be pregnant, or are planning to become pregnant. If you drink alcohol: Limit how much you use to: 0-1 drink a [place]all for women. 0-2 drinks a [place] for men. Be aware of how much alcohol is in your drink. In the [place] one drink equals one 12 oz bottle of beer (355 mL), one 5 oz glass of wine (148 mL), or one 1 oz glass of hard liquor (44 mL). Medicines In addition to  diet and lifestyle changes, your health care provider may recommend medicines to help lower your blood pressure. In general: You may need to try a few different medicines to find what works best for you. You may need to take more than one medicine. Take over-the-counter and prescription medicines only as told by your health care provider. Questions to ask your health care provider What is my blood pressure goal? How can I lower my risk for high blood pressure? How should I monitor my blood pressure at home? Where to find support Your health care provider can help you prevent hypertension and help you keep your blood pressure at a healthy level. Your local hospital or your community may also provide support services and prevention programs. The American Heart Association offers an online support network at supportnetwork.heart.org Where to find more information Learn more about hypertension from: [place], Lung, and [place]: https://wilson-eaton.com/ Centers for Disease Control and Prevention: http://www.wolf.info/ American Academy of Family Physicians: familydoctor.org Learn more about the DASH diet from: [place], Lung, and [place]: https://wilson-eaton.com/ Contact a health care provider if: You think you are having a reaction to medicines you have taken. You have recurrent headaches or feel dizzy. You have swelling in your ankles. You have trouble with your  vision. Get help right away if: You have sudden, severe chest, back, or abdominal pain or discomfort. You have shortness of breath. You have a sudden, severe headache. These symptoms may represent a serious problem that is an emergency. Do not wait to see if the symptoms will go away. Get medical help right away. Call your local emergency services (911 in the [place].S.). Do not drive yourself to the hospital.  Summary Hypertension often does not cause any symptoms until blood pressure is very high. It is important to get your blood  pressure checked regularly. Diet and lifestyle changes are important steps in preventing hypertension. By keeping your blood pressure in a healthy range, you may prevent complications like heart attack, heart failure, stroke, and kidney failure. Work with your health care provider to make a hypertension prevention plan that works for you. This information is not intended to replace advice given to you by your health care provider. Make sure you discuss any questions you have with your health care provider. Document Revised: 09/15/2019 Document Reviewed: 09/15/2019 Elsevier Patient Education  2022 [employer].

## 2021-09-06 LAB — CMP14+EGFR
ALT: 40 IU/L (ref 0–44)
AST: 32 IU/L (ref 0–40)
Albumin/Globulin Ratio: 1.5 (ref 1.2–2.2)
Albumin: 5 g/dL (ref 4.0–5.0)
Alkaline Phosphatase: 66 IU/L (ref 44–121)
BUN/Creatinine Ratio: 17 (ref 9–20)
BUN: 21 mg/dL (ref 6–24)
Bilirubin Total: 0.5 mg/dL (ref 0.0–1.2)
CO2: 24 mmol/L (ref 20–29)
Calcium: 10.1 mg/dL (ref 8.7–10.2)
Chloride: 101 mmol/L (ref 96–106)
Creatinine, Ser: 1.25 mg/dL (ref 0.76–1.27)
Globulin, Total: 3.3 g/dL (ref 1.5–4.5)
Glucose: 103 mg/dL — ABNORMAL HIGH (ref 70–99)
Potassium: 4.2 mmol/L (ref 3.5–5.2)
Sodium: 139 mmol/L (ref 134–144)
Total Protein: 8.3 g/dL (ref 6.0–8.5)
eGFR: 73 mL/min/{1.73_m2} (ref >59)

## 2021-09-06 LAB — CBC WITH DIFFERENTIAL/PLATELET
Basophils Absolute: 0 10*3/uL (ref 0.0–0.2)
Basos: 1 %
EOS (ABSOLUTE): 0.2 10*3/uL (ref 0.0–0.4)
Eos: 4 %
Hematocrit: 45.5 % (ref 37.5–51.0)
Hemoglobin: 15.8 g/dL (ref 13.0–17.7)
Immature Grans (Abs): 0 10*3/uL (ref 0.0–0.1)
Immature Granulocytes: 0 %
Lymphocytes Absolute: 3.1 10*3/uL (ref 0.7–3.1)
Lymphs: 46 %
MCH: 28.7 pg (ref 26.6–33.0)
MCHC: 34.7 g/dL (ref 31.5–35.7)
MCV: 83 fL (ref 79–97)
Monocytes Absolute: 0.6 10*3/uL (ref 0.1–0.9)
Monocytes: 9 %
Neutrophils Absolute: 2.6 10*3/uL (ref 1.4–7.0)
Neutrophils: 40 %
Platelets: 245 10*3/uL (ref 150–450)
RBC: 5.51 x10E6/uL (ref 4.14–5.80)
RDW: 13.4 % (ref 11.6–15.4)
WBC: 6.6 10*3/uL (ref 3.4–10.8)

## 2021-09-06 LAB — LIPID PANEL
Chol/HDL Ratio: 7.6 ratio — ABNORMAL HIGH (ref 0.0–5.0)
Cholesterol, Total: 272 mg/dL — ABNORMAL HIGH (ref 100–199)
HDL: 36 mg/dL — ABNORMAL LOW (ref >39)
LDL Chol Calc (NIH): 177 mg/dL — ABNORMAL HIGH (ref 0–99)
Triglycerides: 304 mg/dL — ABNORMAL HIGH (ref 0–149)
VLDL Cholesterol Cal: 59 mg/dL — ABNORMAL HIGH (ref 5–40)

## 2021-09-10 MED ORDER — ATORVASTATIN CALCIUM 40 MG PO TABS: 40.0000 mg | ORAL_TABLET | Freq: Every day | ORAL | 3 refills | Status: DC

## 2021-09-12 ENCOUNTER — Telehealth (INDEPENDENT_AMBULATORY_CARE_PROVIDER_SITE_OTHER): Payer: Self-pay

## 2021-09-12 NOTE — Telephone Encounter (Signed)
-----   Message from Kerin Perna, NP sent at 09/10/2021  6:41 PM EST ----- Sent in atorvastatin 40mg  take at bedtime. Your cholesterol is  high, This turn lead to stroke and/or heart attack down the road.  To reduce your LDL, Remember - more fruits and vegetables, more fish, and limit red meat and dairy products. More soy, nuts, beans, barley, lentils, oats and plant sterol ester enriched margarine instead of butter. I also encourage eliminating sugar and processed food.

## 2021-09-12 NOTE — Telephone Encounter (Signed)
Patient is aware of lab results and medication being sent. Provided dietary advise as well. He verbalized understanding. Nat Christen, CMA

## 2021-12-06 ENCOUNTER — Ambulatory Visit (INDEPENDENT_AMBULATORY_CARE_PROVIDER_SITE_OTHER): Payer: 59 | Admitting: Primary Care

## 2021-12-06 ENCOUNTER — Encounter (INDEPENDENT_AMBULATORY_CARE_PROVIDER_SITE_OTHER): Payer: Self-pay | Admitting: Primary Care

## 2021-12-06 ENCOUNTER — Other Ambulatory Visit: Payer: Self-pay

## 2021-12-06 VITALS — BP 127/85 | HR 84 | Temp 97.9°F | Ht 67.0 in | Wt 209.4 lb

## 2021-12-06 DIAGNOSIS — R030 Elevated blood-pressure reading, without diagnosis of hypertension: Secondary | ICD-10-CM | POA: Diagnosis not present

## 2021-12-06 DIAGNOSIS — E782 Mixed hyperlipidemia: Secondary | ICD-10-CM | POA: Diagnosis not present

## 2021-12-06 DIAGNOSIS — Z6832 Body mass index (BMI) 32.0-32.9, adult: Secondary | ICD-10-CM

## 2021-12-06 NOTE — Patient Instructions (Signed)

## 2021-12-06 NOTE — Progress Notes (Signed)
Charles Shaffer, is a 46 y.o. male  VFI:[id number]  CZY:[id number]  DOB - November 25, 1975  Chief Complaint  Patient presents with   Follow-up    BP check        Subjective:   Mr. Charles Shaffer is a 46 y.o.obese  male here today for a follow up visit for blood pressure.Patient refuses to take medication states he knows what to do and eat. As shown by his Bp being wnl. Unable to emphasize risk of heart attack or stroke with elevated cholesterol panel. Patient has No headache, No chest pain, No abdominal pain - No Nausea, No new weakness tingling or numbness, No Cough - SOB.  No problems updated.  ALLERGIES: Allergies  Allergen Reactions   Cashew Nut Oil Other (See Comments)    Numbness on tongue and mouth    PAST MEDICAL HISTORY: Past Medical History:  Diagnosis Date   High cholesterol    Hypertension     MEDICATIONS AT HOME: Prior to Admission medications   Medication Sig Start Date End Date Taking? Authorizing Provider  atorvastatin (LIPITOR) 40 MG tablet Take 1 tablet (40 mg total) by mouth daily. Patient not taking: Reported on 12/06/2021 09/10/21   Kerin Perna, NP    Objective:   Vitals:   12/06/21 1011  BP: 127/85  Pulse: 84  Temp: 97.9 F (36.6 C)  TempSrc: Oral  SpO2: 94%  Weight: 209 lb 6.4 oz (95 kg)  Height: 5\' 7"  (1.702 m)   Exam General appearance : Awake, alert, not in any distress. Speech Clear. Not toxic looking HEENT: Atraumatic and Normocephalic, pupils equally reactive to light and accomodation Neck: Supple, no JVD. No cervical lymphadenopathy.  Chest: Good air entry bilaterally, no added sounds  CVS: S1 S2 regular, no murmurs.  Abdomen: Bowel sounds present, Non tender and not distended with no gaurding, rigidity or rebound. Extremities: B/L Lower Ext shows no edema, both legs are warm to touch Neurology: Awake alert, and oriented X 3, Non focal Skin: No Rash  Data Review Lab Results  Component Value Date    HGBA1C 5.9 (A) 09/05/2021   HGBA1C 5.7 (A) 05/15/2018   HGBA1C 5.8 (H) 02/05/2016    Assessment & Plan  Osborn was seen today for follow-up.  Diagnoses and all orders for this visit:  Class 2 severe obesity due to excess calories with serious comorbidity in adult, unspecified BMI ([place]) Obesity is 30-39 indicating an excess in caloric intake or underlining conditions. This may lead to other co-morbidities. Lifestyle modifications of diet and exercise may reduce obesity.    Elevated BP without diagnosis of hypertension Resolved from diet modification   Mixed hyperlipidemia Refuses medication . States like he lower his Bp next visit will check fasting labs  Patient have been counseled extensively about nutrition and exercise. Other issues discussed during this visit include: low cholesterol diet, weight control and daily exercise, foot care, annual eye examinations at Ophthalmology, importance of adherence with medications and regular follow-up. We also discussed long term complications of uncontrolled diabetes and hypertension.   Return if symptoms worsen or fail to improve.  The patient was given clear instructions to go to ER or return to medical center if symptoms don't improve, worsen or new problems develop. The patient verbalized understanding. The patient was told to call to get lab results if they haven't heard anything in the next week.   This note has been created with Surveyor, quantity.  Any transcriptional errors are unintentional.   Kerin Perna, NP 12/06/2021, 2:55 PM

## 2023-04-08 ENCOUNTER — Other Ambulatory Visit: Payer: Self-pay

## 2023-04-08 ENCOUNTER — Ambulatory Visit
Admission: RE | Admit: 2023-04-08 | Discharge: 2023-04-08 | Disposition: A | Discharge status: Home / Self Care | Payer: Medicaid Other | Source: Ambulatory Visit | Attending: Family Medicine | Admitting: Family Medicine

## 2023-04-08 ENCOUNTER — Other Ambulatory Visit: Payer: Self-pay | Admitting: Family Medicine

## 2023-04-08 DIAGNOSIS — M25512 Pain in left shoulder: Secondary | ICD-10-CM

## 2023-05-13 ENCOUNTER — Ambulatory Visit: Payer: Medicaid Other | Admitting: Surgical

## 2023-05-13 ENCOUNTER — Other Ambulatory Visit: Payer: Self-pay

## 2023-05-13 DIAGNOSIS — M5412 Radiculopathy, cervical region: Secondary | ICD-10-CM

## 2023-05-13 DIAGNOSIS — M7542 Impingement syndrome of left shoulder: Secondary | ICD-10-CM

## 2023-05-14 ENCOUNTER — Encounter: Payer: Self-pay | Admitting: Surgical

## 2023-05-14 MED ORDER — BUPIVACAINE HCL 0.5 % IJ SOLN
9.0000 mL | INTRAMUSCULAR | Status: AC | PRN
Start: 2023-05-13 — End: 2023-05-13
  Administered 2023-05-13: 9 mL via INTRA_ARTICULAR

## 2023-05-14 MED ORDER — METHYLPREDNISOLONE ACETATE 40 MG/ML IJ SUSP
40.0000 mg | INTRAMUSCULAR | Status: AC | PRN
Start: 2023-05-13 — End: 2023-05-13
  Administered 2023-05-13: 40 mg via INTRA_ARTICULAR

## 2023-05-14 MED ORDER — LIDOCAINE HCL 1 % IJ SOLN
5.0000 mL | INTRAMUSCULAR | Status: AC | PRN
Start: 2023-05-13 — End: 2023-05-13
  Administered 2023-05-13: 5 mL

## 2023-05-14 NOTE — Progress Notes (Signed)
Office Visit Note   Patient: Charles Shaffer           Date of Birth: 1975-11-13           MRN: [id number] Visit Date: 05/13/2023 Requested by: Dot Been, FNP [address],  [place] [address] PCP: Dot Been, FNP  Subjective: Chief Complaint  Patient presents with   Left Shoulder - Pain    HPI: Charles Shaffer is a 47 y.o. male who presents to the office reporting left shoulder pain.  Patient localizes pain diffusely throughout the lateral aspect of the left shoulder.  Also has pain in the trapezius and scapular regions.  Occasional neck pain.  Some the pain will radiate down to the mid humeral and bicep region but no radiation past the elbow.  No numbness or tingling.  No burning sensation.  He has had pain for several years without any history of known injury.  He describes constant pain even at rest.  Pain will wake him up at night at times.  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  No history of diabetes, smoking, blood thinner use.  Does not take any medications for pain.  He has tried multiple medications but none of them have really been helpful over the last several years.  No history of prior surgery to the neck or shoulder or any prior injections..                ROS: All systems reviewed are negative as they relate to the chief complaint within the history of present illness.  Patient denies fevers or chills.  Assessment & Plan: Visit Diagnoses:  1. Radiculopathy, cervical region     Plan: Patient is a 47 year old male who presents for evaluation of left shoulder pain.  Has history of left shoulder pain that has been present for several years.  Has pain in the lateral shoulder region but also pain in the trapezius, scapular, mid humeral regions as well.  No radicular pain past the elbow.  On exam, has excellent rotator cuff strength and no limit to his shoulder range of motion  compared with contralateral side.  There is really no area of focal tenderness.  The most compelling exam maneuver is a positive Spurling sign which reproduces pain through the majority of the distribution of what he complains about.  Does have weakly positive Neer's impingement sign so plan to administer subacromial injection today.  He tolerated this procedure well without complication.  Plan for him to see how this does and send me a message on MyChart or call the office later this week or early next week to report how much relief he has had.  If he has really no improvement from that subacromial injection, at that point would recommend MRI of the cervical spine to evaluate for cervical pathology that could be contributing to left shoulder pain.  Follow-Up Instructions: No follow-ups on file.   Orders:  Orders Placed This Encounter  Procedures   XR Cervical Spine 2 or 3 views   No orders of the defined types were placed in this encounter.     Procedures: Large Joint Inj: L subacromial bursa on 05/13/2023 8:22 AM Indications: diagnostic evaluation and pain Details: 18 G 1.5 in needle, posterior approach  Arthrogram: No  Medications: 9 mL bupivacaine 0.5 %; 40 mg methylPREDNISolone acetate 40 MG/ML; 5 mL lidocaine  1 % Outcome: tolerated well, no immediate complications Procedure, treatment alternatives, risks and benefits explained, specific risks discussed. Consent was given by the patient. Immediately prior to procedure a time out was called to verify the correct patient, procedure, equipment, support staff and site/side marked as required. Patient was prepped and draped in the usual sterile fashion.       Clinical Data: No additional findings.  Objective: Vital Signs: There were no vitals taken for this visit.  Physical Exam:  Constitutional: Patient appears well-developed HEENT:  Head: Normocephalic Eyes:EOM are normal Neck: Normal range of motion Cardiovascular: Normal  rate Pulmonary/chest: Effort normal Neurologic: Patient is alert Skin: Skin is warm Psychiatric: Patient has normal mood and affect  Ortho Exam: Ortho exam demonstrates left shoulder with 50 degrees X rotation, 120 degrees abduction, 180 degrees forward elevation passively and actively.  This compared with the right shoulder with 60 degrees X rotation, 110 degrees abduction, 180 degrees forward elevation passively and actively.  He has excellent rotator cuff strength of supra, infra, subscap rated 5/5.  Axillary nerve intact with deltoid firing.  No tenderness over the [facility] joint.  Mild tenderness over the bicipital groove.  Negative O'Brien sign.  Negative Hawkins but positive Neer's impingement sign.  There is no coarse grinding or crepitus noted with passive motion of the shoulder.  He does have positive Spurling sign with reproduction of pain in the trapezius and lateral shoulder region.  5/5 motor strength of EPL, FPL, finger abduction, grip strength, pronation/supination, bicep, tricep, deltoid.  Negative Hornblower sign.  Negative external rotation lag sign.  Specialty Comments:  No specialty comments available.  Imaging: No results found.   PMFS History: Patient Active Problem List   Diagnosis Date Noted   Numbness 02/05/2016   Arm numbness 02/05/2016   Past Medical History:  Diagnosis Date   High cholesterol    Hypertension     Family History  Problem Relation Age of Onset   Stroke Brother 69   Diabetes Brother    Diabetes Father 59   CAD Father 59   Diabetes Mother    Diabetes Maternal Aunt     No past surgical history on file. Social History   Occupational History   Not on file  Tobacco Use   Smoking status: Never   Smokeless tobacco: Never  Substance and Sexual Activity   Alcohol use: Yes    Alcohol/week: 0.0 standard drinks of alcohol    Comment: occ   Drug use: No   Sexual activity: Yes

## 2023-06-03 DIAGNOSIS — M542 Cervicalgia: Secondary | ICD-10-CM

## 2023-06-04 NOTE — Telephone Encounter (Signed)
I'd recommend MRI c-spine and follow-up with Dr August Saucer

## 2023-06-21 ENCOUNTER — Encounter: Payer: Self-pay | Admitting: Surgical

## 2023-06-25 ENCOUNTER — Other Ambulatory Visit: Payer: Medicaid Other

## 2023-06-25 NOTE — Telephone Encounter (Signed)
If insurance will not cover MRI scan, probably needs reevaluation which would be best done by Dr. August Saucer to get his opinion

## 2023-09-20 ENCOUNTER — Emergency Department (HOSPITAL_COMMUNITY): Payer: No Typology Code available for payment source

## 2023-09-20 ENCOUNTER — Emergency Department (HOSPITAL_COMMUNITY)
Admission: EM | Admit: 2023-09-20 | Discharge: 2023-09-21 | Disposition: A | Discharge status: Home / Self Care | Payer: No Typology Code available for payment source | Attending: Emergency Medicine | Admitting: Emergency Medicine

## 2023-09-20 ENCOUNTER — Other Ambulatory Visit: Payer: Self-pay

## 2023-09-20 ENCOUNTER — Encounter (HOSPITAL_COMMUNITY): Payer: Self-pay

## 2023-09-20 DIAGNOSIS — M79604 Pain in right leg: Secondary | ICD-10-CM | POA: Diagnosis not present

## 2023-09-20 DIAGNOSIS — Y92410 Unspecified street and highway as the place of occurrence of the external cause: Secondary | ICD-10-CM | POA: Insufficient documentation

## 2023-09-20 DIAGNOSIS — R202 Paresthesia of skin: Secondary | ICD-10-CM | POA: Diagnosis not present

## 2023-09-20 DIAGNOSIS — M25511 Pain in right shoulder: Secondary | ICD-10-CM | POA: Insufficient documentation
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
## 2023-09-21 MED ORDER — NAPROXEN 250 MG PO TABS: 250.0000 mg | ORAL_TABLET | Freq: Two times a day (BID) | ORAL | 0 refills | Status: DC

## 2023-09-21 MED ORDER — NAPROXEN 250 MG PO TABS
250.0000 mg | ORAL_TABLET | Freq: Once | ORAL | Status: AC
  Administered 2023-09-21: 250 mg via ORAL
  Filled 2023-09-21: qty 1
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
## 2023-09-21 NOTE — Discharge Instructions (Addendum)
Take naproxen 1 tablet 2 times a Musial for pain.  Can alternate with Tylenol every 4-6 hours.  Do not take naproxen and ibuprofen at the same time or on the same days as collectively they can cause stomach or kidney issues.  If extremity begins to feel cold, excruciatingly painful, becomes numb, or changes color, return to the ED.  Otherwise follow-up with PCP for possible PT referral if shoulder pain continues without improvement.

## 2023-11-12 ENCOUNTER — Telehealth: Payer: Self-pay

## 2023-11-12 NOTE — Telephone Encounter (Signed)
 Received referral from Dr Midge Aver at [facility] - for OSA eval due to reported chronic fatigue and snoring. Notes are also in EPIC. Placed in sleep mailbox

## 2023-12-04 ENCOUNTER — Encounter: Payer: Self-pay | Admitting: Neurology

## 2023-12-04 ENCOUNTER — Ambulatory Visit: Payer: Medicaid Other | Admitting: Neurology

## 2023-12-04 VITALS — BP 129/83 | HR 101 | Ht 67.0 in | Wt 214.2 lb

## 2023-12-04 DIAGNOSIS — G4726 Circadian rhythm sleep disorder, shift work type: Secondary | ICD-10-CM | POA: Diagnosis not present

## 2023-12-04 DIAGNOSIS — G4719 Other hypersomnia: Secondary | ICD-10-CM | POA: Insufficient documentation

## 2023-12-04 DIAGNOSIS — Z6833 Body mass index (BMI) 33.0-33.9, adult: Secondary | ICD-10-CM

## 2023-12-04 DIAGNOSIS — E66811 Obesity, class 1: Secondary | ICD-10-CM | POA: Diagnosis not present

## 2023-12-04 DIAGNOSIS — E6609 Other obesity due to excess calories: Secondary | ICD-10-CM | POA: Insufficient documentation

## 2023-12-04 DIAGNOSIS — R0683 Snoring: Secondary | ICD-10-CM | POA: Insufficient documentation

## 2023-12-04 DIAGNOSIS — R7303 Prediabetes: Secondary | ICD-10-CM | POA: Insufficient documentation

## 2023-12-04 NOTE — Patient Instructions (Addendum)
 Hypersomnia  The patient does not feel he is at risk while driving-   Declined modafinil.  Hypersomnia is a condition in which a person feels very tired during the [place] even though the person gets plenty of sleep at night. A person with this condition may take naps during the [place] and may find it very difficult to wake up from sleep. Hypersomnia may affect a person's ability to think, concentrate, drive, or remember things. What are the causes? The cause of this condition may not be known. Possible causes include: Taking certain medicines. Using drugs or alcohol. Sleep disorders, such as narcolepsy and sleep apnea. Injury to the head, brain, or spinal cord. Tumors. Certain medical conditions. These include: Depression. Diabetes. Gastroesophageal reflux disease (GERD). An underactive thyroid gland (hypothyroidism). What are the signs or symptoms? The main symptoms of hypersomnia include: Feeling very tired throughout the [place], regardless of how much sleep you got the night before. Having trouble waking up. Others may find it difficult to wake you up when you are sleeping. Sleeping for longer and longer periods at a time. Taking naps throughout the [place]. Other symptoms may include: Feeling restless, anxious, or annoyed. Lacking energy. Having trouble with: Remembering. Speaking. Thinking. Loss of appetite. Seeing, hearing, tasting, smelling, or feeling things that are not real (hallucinations). How is this diagnosed? This condition may be diagnosed based on: Your symptoms and medical history. Your sleeping habits. Your health care provider may ask you to write down your sleeping habits in a daily sleep log, along with any symptoms you have. A series of tests that are done while you sleep (sleep study or polysomnogram). A test that measures how quickly you can fall asleep during the Mishra (daytime nap study or multiple sleep latency test). How is this treated? This condition may be  treated by: Following a regular sleep routine. Making lifestyle changes, such as changing your eating habits, getting regular exercise, and avoiding alcohol or caffeinated beverages. Taking medicines to make you more alert (stimulants) during the [place]. Treating any underlying medical causes of hypersomnia. Follow these instructions at home: Sleep habits Stick to a routine that includes going to bed and waking up at the same times every Peretz and night. Practice a relaxing bedtime routine. This may include reading, meditation, deep breathing, or taking a warm bath before going to sleep. Exercise regularly as told by your health care provider. However, avoid exercising in the hours right before bedtime. Keep your sleep environment at a cooler temperature, darkened, and quiet. Sleep with pillows and a mattress that are comfortable and supportive. Schedule short 20-minute naps for when you feel sleepiest during the [place]. Talk with your employer or teachers about your hypersomnia. If possible, adjust your schedule so that: You have a regular daytime work schedule. You can take a scheduled nap during the Deakins. You do not have to work or be active at night. Do not eat a heavy meal for a few hours before bedtime. Eat your meals at about the same times every Mi. Safety  Do not drive or use machinery if you are sleepy. Ask your health care provider if it is safe for you to drive. Wear a life jacket when swimming or spending time near water. General instructions  Take over-the-counter and prescription medicines only as told by your health care provider. This includes supplements. Avoid drinking alcohol or caffeinated beverages. Keep a sleep log that will help your health care provider manage your condition. This may include information about: What  time you go to bed each night. How often you wake up at night. How many hours you sleep at night. How often and for how long you nap during the Kram[place]. Any  observations from others, such as leg movements during sleep, sleep walking, or snoring. Keep all follow-up visits. This is important. Contact a health care provider if: You have new symptoms. Your symptoms get worse. Get help right away if: You have thoughts about hurting yourself or someone else. Get help right away if you feel like you may hurt yourself or others, or have thoughts about taking your own life. Go to your nearest emergency room or: Call 911. Call the National Suicide Prevention Lifeline at 938-773-0521 or 988. This is open 24 hours a Debo. Text the Crisis Text Line at 989-435-6398. Summary Hypersomnia refers to a condition in which you feel very tired during the Pr[place] even though you get plenty of sleep at night. A person with this condition may take naps during the [place] and may find it very difficult to wake up from sleep. Hypersomnia may affect a person's ability to think, concentrate, drive, or remember things. Treatment may include a regular sleep routine and making some lifestyle changes. This information is not intended to replace advice given to you by your health care provider. Make sure you discuss any questions you have with your health care provider. Document Revised: 09/25/2021 Document Reviewed: 09/25/2021 Elsevier Patient Education  2024 [employer].
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
## 2023-12-10 ENCOUNTER — Telehealth: Payer: Self-pay | Admitting: Neurology

## 2023-12-10 NOTE — Telephone Encounter (Signed)
[facility] pending uploaded notes.

## 2023-12-23 NOTE — Telephone Encounter (Signed)
 spoke with pt he wcb to schedule

## 2023-12-23 NOTE — Telephone Encounter (Signed)
 [facility] [place]: [id number] (exp. 12/10/23 to 01/27/24)

## 2023-12-23 NOTE — Telephone Encounter (Signed)
 [facility] [place]: [id number] (exp. 12/10/23 to 01/27/24)   Patient is scheduled at [facility] for 01/13/24 at 9 pm.  Mailed packet to the patient.

## 2024-01-13 ENCOUNTER — Ambulatory Visit (INDEPENDENT_AMBULATORY_CARE_PROVIDER_SITE_OTHER): Payer: No Typology Code available for payment source | Admitting: Neurology

## 2024-01-13 DIAGNOSIS — G4726 Circadian rhythm sleep disorder, shift work type: Secondary | ICD-10-CM

## 2024-01-13 DIAGNOSIS — R0683 Snoring: Secondary | ICD-10-CM

## 2024-01-13 DIAGNOSIS — R7303 Prediabetes: Secondary | ICD-10-CM

## 2024-01-13 DIAGNOSIS — E66811 Obesity, class 1: Secondary | ICD-10-CM

## 2024-01-13 DIAGNOSIS — G4719 Other hypersomnia: Secondary | ICD-10-CM

## 2024-01-13 DIAGNOSIS — E6609 Other obesity due to excess calories: Secondary | ICD-10-CM

## 2024-01-17 ENCOUNTER — Encounter: Payer: Self-pay | Admitting: Neurology
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
## 2024-01-17 NOTE — Addendum Note (Signed)
 Addended by: Melvyn Novas on: 01/17/2024 05:44 PM   Modules accepted: Orders

## 2024-01-20 ENCOUNTER — Telehealth: Payer: Self-pay

## 2024-01-20 NOTE — Telephone Encounter (Signed)
 Called patient to informed him of his moderate sleep apnea and treatment option.  I called Charles Shaffer. I advised Charles Shaffer that Dr. Vickey Huger reviewed their sleep study results and found that Charles Shaffer has moderate sleep apnea. Dr. Vickey Huger recommends that Charles Shaffer start using a CPAP. I reviewed PAP compliance expectations with the Charles Shaffer. Charles Shaffer is agreeable to starting a CPAP. I advised Charles Shaffer that an order will be sent to a DME, [employer] [facility], and they will call the Charles Shaffer within about one week after they file with the Charles Shaffer's insurance. [employer] will show the Charles Shaffer how to use the machine, fit for masks, and troubleshoot the CPAP if needed. A follow up appt was made for insurance purposes with Dr. Vickey Huger on 01/20/24. Charles Shaffer verbalized understanding to arrive 15 minutes early and bring their CPAP. Charles Shaffer verbalized understanding of results. Charles Shaffer had no questions at this time but was encouraged to call back if questions arise. I have sent the order to [employer] and have received confirmation that they have received the order.

## 2024-01-20 NOTE — Telephone Encounter (Signed)
-----   Message from Hayneville Dohmeier sent at 01/17/2024  5:44 PM EDT ----- 1. Moderate Complex Sleep Apnea was present_ Obstructive Sleep apnea was dominant with a smaller proportion of central apneas . A total of 66 apneas (55 obstructive; 11 central; 0 mixed), and 46 hypopneas. -and this apnea was REM sleep dependent.  2.  Apnea and snoring were also supine sleep dependent- the severest apnea was seen in supine REM sleep.  The apnea-hypopnea index was 18.6/ h overall (AHI was 41.2/h in  supine sleep , only  4/h in  non-supine; and AHI was 30.2/h in  REM and 14/h in non-REM  ,  The AHI peaked at 91.5/h in  supine REM   The Combination of REM sleep exacerbating apnea, complex apnea and hypoxia to a nadir of 75%  would make PAP therapy the most effective therapy.  DME order for ResMed Auto titration CPAP 7-18 cm water, 2 cm EPR and heated humidification with  a mask of patients comfort ( snoring ) .  Nasal patency is important, consider saline nasal spray to help keeping the nasal passage open.     In addition, this patient should avoid supine sleep (!) and work with his medical provider on weight loss and core strength training to overcome sleep apnea.

## 2024-06-18 ENCOUNTER — Ambulatory Visit (INDEPENDENT_AMBULATORY_CARE_PROVIDER_SITE_OTHER): Admitting: Primary Care

## 2024-06-18 ENCOUNTER — Encounter (INDEPENDENT_AMBULATORY_CARE_PROVIDER_SITE_OTHER): Payer: Self-pay | Admitting: Primary Care

## 2024-06-18 VITALS — BP 129/91 | HR 99 | Temp 98.5°F | Resp 20 | Ht 67.0 in | Wt 212.0 lb

## 2024-06-18 DIAGNOSIS — F4310 Post-traumatic stress disorder, unspecified: Secondary | ICD-10-CM | POA: Diagnosis not present

## 2024-06-18 NOTE — Progress Notes (Signed)
 Referral

## 2024-06-18 NOTE — Progress Notes (Signed)
 [facility]  Heraclio Cleere, is a 48 y.o. male  RDW:[id number]  FMW:[id number]  DOB - Nov 25, 1975  Chief Complaint  Patient presents with   Referral       Subjective:   Kingdom Franson is a 48 y.o. male here today for an acute visit. Insurance requesting patient seek behavior health. No thoughts of harming self or others, no auditory or visual hallucinations. PTSD 18 years in prison- feels like smothering or closed in.   HPI  No problems updated.  Comprehensive ROS Pertinent positive and negative noted in HPI   Allergies  Allergen Reactions   Cashew Nut Oil Other (See Comments)    Numbness on tongue and mouth    Past Medical History:  Diagnosis Date   High cholesterol    Hypertension     Current Outpatient Medications on File Prior to Visit  Medication Sig Dispense Refill   amLODipine  (NORVASC ) 5 MG tablet Take 5 mg by mouth daily. (Patient not taking: Reported on 06/18/2024)     atorvastatin  (LIPITOR) 40 MG tablet Take 1 tablet (40 mg total) by mouth daily. (Patient not taking: Reported on 06/18/2024) 90 tablet 3   cyclobenzaprine  (FLEXERIL ) 10 MG tablet Take 10 mg by mouth 3 (three) times daily as needed for muscle spasms. (Patient not taking: Reported on 06/18/2024)     fenofibrate (TRICOR) 145 MG tablet Take 145 mg by mouth daily. (Patient not taking: Reported on 06/18/2024)     naproxen  (NAPROSYN ) 250 MG tablet Take 1 tablet (250 mg total) by mouth 2 (two) times daily. (Patient not taking: Reported on 06/18/2024) 30 tablet 0   Vitamin D, Cholecalciferol, 25 MCG (1000 UT) TABS Take 1,000 Units by mouth daily. (Patient not taking: Reported on 06/18/2024)     No current facility-administered medications on file prior to visit.   Health Maintenance  Topic Date Due   Hepatitis B Vaccine (1 of 3 - 19+ 3-dose series) Never done   HPV Vaccine (1 - 3-dose SCDM series) Never done   COVID-19 Vaccine (1 - 2024-25 season) Never done   Colon Cancer Screening  Never done    Flu Shot  05/29/2024   DTaP/Tdap/Td vaccine (2 - Td or Tdap) 09/06/2031   Hepatitis C Screening  Completed   HIV Screening  Completed   Pneumococcal Vaccine  Aged Out   Meningitis B Vaccine  Aged Out    Objective:   Vitals:   06/18/24 1502  BP: (!) 129/91  Pulse: 99  Resp: 20  Temp: 98.5 F (36.9 C)  TempSrc: Oral  SpO2: 98%  Weight: 212 lb (96.2 kg)  Height: 5' 7 (1.702 m)   BP Readings from Last 3 Encounters:  06/18/24 (!) 129/91  12/04/23 129/83  09/21/23 (!) 141/91      Physical Exam Vitals reviewed.  Constitutional:      Appearance: He is obese.  HENT:     Head: Normocephalic.     Right Ear: Tympanic membrane and external ear normal.     Left Ear: Tympanic membrane and external ear normal.     Nose: Nose normal.  Eyes:     Extraocular Movements: Extraocular movements intact.  Cardiovascular:     Rate and Rhythm: Normal rate and regular rhythm.  Pulmonary:     Effort: Pulmonary effort is normal.     Breath sounds: Normal breath sounds.  Abdominal:     General: Bowel sounds are normal. There is distension.     Palpations: Abdomen is soft.  Musculoskeletal:  General: Normal range of motion.     Cervical back: Normal range of motion and neck supple.  Skin:    General: Skin is warm and dry.  Neurological:     Mental Status: He is oriented to person, place, and time.  Psychiatric:        Mood and Affect: Mood normal.        Behavior: Behavior normal.        Thought Content: Thought content normal.        Judgment: Judgment normal.      Assessment & Plan  Jerri was seen today for referral.  Diagnoses and all orders for this visit:  PTSD (post-traumatic stress disorder) -     Ambulatory referral to Psychiatry    Patient have been counseled extensively about nutrition and exercise. Other issues discussed during this visit include: low cholesterol diet, weight control and daily exercise, foot care, annual eye examinations at Ophthalmology,  importance of adherence with medications and regular follow-up. We also discussed long term complications of uncontrolled diabetes and hypertension.     The patient was given clear instructions to go to ER or return to medical center if symptoms don't improve, worsen or new problems develop. The patient verbalized understanding. The patient was told to call to get lab results if they haven't heard anything in the next week.   This note has been created with Education officer, environmental. Any transcriptional errors are unintentional.   Rosaline SHAUNNA Bohr, NP 06/18/2024, 3:21 PM

## 2024-08-04 ENCOUNTER — Ambulatory Visit (INDEPENDENT_AMBULATORY_CARE_PROVIDER_SITE_OTHER): Payer: Self-pay | Admitting: Licensed Clinical Social Worker

## 2024-08-04 DIAGNOSIS — F419 Anxiety disorder, unspecified: Secondary | ICD-10-CM

## 2024-08-04 NOTE — Progress Notes (Unsigned)
 Tried to seek help when inside Once did come home fear of going back Dreams of being in prison again Coral don't want to be around anybody Don't go out anymore Trying to date come to the house 6 years and then 12 years Released when 23 and then 6 months went back Really one 30 missed 18 years Hurt me the most got out grandmother dementia Memories everybody had Missed all the growing up no memories with them; now people are passing and sister and brother can talk about experiences; feel like strongest in the family Haven't even lived it's so sad Kids probably like my lifeline two kids ages 2 and 4 Facing charges now for 5 years Hit-n-run and insurance fraud Santina last month continued till November Been home going on 12 years Lonely in world Everybody's Medical laboratory scientific officer

## 2024-08-05 ENCOUNTER — Encounter (HOSPITAL_COMMUNITY): Payer: Self-pay

## 2024-09-01 ENCOUNTER — Encounter (HOSPITAL_COMMUNITY): Payer: Self-pay

## 2024-09-01 ENCOUNTER — Ambulatory Visit (HOSPITAL_COMMUNITY): Admitting: Licensed Clinical Social Worker

## 2024-09-10 ENCOUNTER — Encounter (INDEPENDENT_AMBULATORY_CARE_PROVIDER_SITE_OTHER): Payer: Self-pay | Admitting: Primary Care

## 2024-09-10 ENCOUNTER — Ambulatory Visit (INDEPENDENT_AMBULATORY_CARE_PROVIDER_SITE_OTHER): Admitting: Primary Care

## 2024-09-10 VITALS — BP 157/90 | HR 54 | Resp 16 | Ht 67.0 in | Wt 217.6 lb

## 2024-09-10 DIAGNOSIS — B353 Tinea pedis: Secondary | ICD-10-CM

## 2024-09-10 DIAGNOSIS — I10 Essential (primary) hypertension: Secondary | ICD-10-CM

## 2024-09-10 MED ORDER — CLOTRIMAZOLE-BETAMETHASONE 1-0.05 % EX CREA: 1.0000 | TOPICAL_CREAM | Freq: Every day | CUTANEOUS | 1 refills | Status: AC

## 2024-09-10 NOTE — Telephone Encounter (Signed)
 Will forward to provider

## 2024-09-13 ENCOUNTER — Encounter (INDEPENDENT_AMBULATORY_CARE_PROVIDER_SITE_OTHER): Payer: Self-pay | Admitting: Primary Care

## 2024-09-13 NOTE — Progress Notes (Signed)
 [facility]  Charles Shaffer, is a 48 y.o. male  RDW:[id number]  FMW:[id number]  DOB - [birth year]  Chief Complaint  Patient presents with   Hypertension       Subjective:   Charles Shaffer is a 48 y.o. male here today for an acute visit.  Patient only concern is bilateral peeling of the skin and pruritus.  Blood pressure remains elevated noncompliant with medications does not feel he needs medication.  Patient has No headache, No chest pain, No abdominal pain - No Nausea, No new weakness tingling or numbness, No Cough - shortness of breath/s Pictures below Hypertension    No problems updated.  Comprehensive ROS Pertinent positive and negative noted in HPI   Allergies  Allergen Reactions   Cashew Nut Oil Other (See Comments)    Numbness on tongue and mouth    Past Medical History:  Diagnosis Date   High cholesterol    Hypertension     Current Outpatient Medications on File Prior to Visit  Medication Sig Dispense Refill   amLODipine  (NORVASC ) 5 MG tablet Take 5 mg by mouth daily. (Patient not taking: Reported on 06/18/2024)     atorvastatin  (LIPITOR) 40 MG tablet Take 1 tablet (40 mg total) by mouth daily. (Patient not taking: Reported on 06/18/2024) 90 tablet 3   cyclobenzaprine  (FLEXERIL ) 10 MG tablet Take 10 mg by mouth 3 (three) times daily as needed for muscle spasms. (Patient not taking: Reported on 06/18/2024)     fenofibrate (TRICOR) 145 MG tablet Take 145 mg by mouth daily. (Patient not taking: Reported on 06/18/2024)     naproxen  (NAPROSYN ) 250 MG tablet Take 1 tablet (250 mg total) by mouth 2 (two) times daily. (Patient not taking: Reported on 06/18/2024) 30 tablet 0   Vitamin D, Cholecalciferol, 25 MCG (1000 UT) TABS Take 1,000 Units by mouth daily. (Patient not taking: Reported on 06/18/2024)     No current facility-administered medications on file prior to visit.   Health Maintenance  Topic Date Due   Hepatitis B Vaccine (1 of 3 - 19+ 3-dose series)  Never done   Colon Cancer Screening  Never done   Flu Shot  Never done   COVID-19 Vaccine (1 - 2025-26 season) Never done   DTaP/Tdap/Td vaccine (2 - Td or Tdap) 09/06/2031   Hepatitis C Screening  Completed   HIV Screening  Completed   Pneumococcal Vaccine  Aged Out   HPV Vaccine  Aged Out   Meningitis B Vaccine  Aged Out    Objective:   Vitals:   09/10/24 1131  BP: (!) 157/90  Pulse: (!) 54  Resp: 16  SpO2: 99%  Weight: 217 lb 9.6 oz (98.7 kg)  Height: 5' 7 (1.702 m)   BP Readings from Last 3 Encounters:  09/10/24 (!) 157/90  06/18/24 (!) 129/91  12/04/23 129/83      Physical Exam Vitals reviewed.  Constitutional:      Appearance: He is obese.  HENT:     Head: Normocephalic.     Right Ear: Tympanic membrane and external ear normal.     Left Ear: Tympanic membrane and external ear normal.     Nose: Nose normal.  Eyes:     Extraocular Movements: Extraocular movements intact.     Pupils: Pupils are equal, round, and reactive to light.  Cardiovascular:     Rate and Rhythm: Normal rate and regular rhythm.  Pulmonary:     Effort: Pulmonary effort is normal.  Breath sounds: Normal breath sounds.  Abdominal:     General: Bowel sounds are normal. There is distension.     Palpations: Abdomen is soft.  Musculoskeletal:        General: Normal range of motion.  Skin:    General: Skin is warm and dry.     Findings: Rash present.  Neurological:     Mental Status: He is oriented to person, place, and time.  Psychiatric:        Mood and Affect: Mood normal.        Behavior: Behavior normal.        Thought Content: Thought content normal.        Judgment: Judgment normal.       Assessment & Plan   Lyncoln was seen today for hypertension.  Diagnoses and all orders for this visit:  Infection, fungal, left foot 2/2  Infection, fungal, right foot    -     clotrimazole -betamethasone (LOTRISONE) cream; Apply 1 Application topically daily.  Essential  hypertension BP goal - < 130/80 Explained that having normal blood pressure is the goal and medications are helping to get to goal and maintain normal blood pressure. DIET: Limit salt intake, read nutrition labels to check salt content, limit fried and high fatty foods  Avoid using multisymptom OTC cold preparations that generally contain sudafed which can rise BP. Consult with pharmacist on best cold relief products to use for persons with HTN EXERCISE Discussed incorporating exercise such as walking - 30 minutes most days of the week and can do in 10 minute intervals        Patient have been counseled extensively about nutrition and exercise. Other issues discussed during this visit include: low cholesterol diet, weight control and daily exercise, foot care, annual eye examinations at Ophthalmology, importance of adherence with medications and regular follow-up. We also discussed long term complications of uncontrolled diabetes and hypertension.   No follow-ups on file.  The patient was given clear instructions to go to ER or return to medical center if symptoms don't improve, worsen or new problems develop. The patient verbalized understanding. The patient was told to call to get lab results if they haven't heard anything in the next week.   This note has been created with Education officer, environmental. Any transcriptional errors are unintentional.   Charles SHAUNNA Bohr, NP 09/13/2024, 1:22 AM

## 2024-09-15 ENCOUNTER — Ambulatory Visit (INDEPENDENT_AMBULATORY_CARE_PROVIDER_SITE_OTHER): Admitting: Podiatry

## 2024-09-15 ENCOUNTER — Encounter: Payer: Self-pay | Admitting: Podiatry

## 2024-09-15 DIAGNOSIS — M79672 Pain in left foot: Secondary | ICD-10-CM

## 2024-09-15 DIAGNOSIS — B351 Tinea unguium: Secondary | ICD-10-CM

## 2024-09-15 DIAGNOSIS — M79671 Pain in right foot: Secondary | ICD-10-CM | POA: Diagnosis not present

## 2024-09-15 DIAGNOSIS — B353 Tinea pedis: Secondary | ICD-10-CM | POA: Diagnosis not present

## 2024-09-15 MED ORDER — KETOCONAZOLE 2 % EX CREA: TOPICAL_CREAM | CUTANEOUS | 6 refills | Status: AC

## 2024-09-15 MED ORDER — TERBINAFINE HCL 250 MG PO TABS: 250.0000 mg | ORAL_TABLET | Freq: Every day | ORAL | 1 refills | Status: AC

## 2024-09-15 NOTE — Progress Notes (Signed)
 Planes of a thickening on the feet bilaterally.  He has been using some Lotrisone on it.  Has been bothering for several months now and getting worse.  Says he gets some areas in between the toe that itch a lot also on the plantar aspect of the foot and along the sides of the feet.   Physical exam:  General appearance: Pleasant, and in no acute distress. AOx3.  Vascular: Pedal pulses: DP 2/4 bilaterally, PT 2/4 bilaterally. MIld edema lower legs bilaterally. Capillary fill time immediate bilaterally.  Neurological: Light touch intact feet bilaterally.  Normal Achilles reflex bilaterally.  No clonus or spasticity noted.   Dermatologic:   Dry scaly erythematous rash in a moccasin distribution feet bilaterally.  Some maceration in the 3rd and 4th webspaces feet bilaterally.  Some onychomycotic changes in the distal aspect of the nail plate with discoloration and subungual debris.  Skin normal temperature bilaterally.  Skin normal color, tone, and texture bilaterally.   Musculoskeletal: Hammertoes 2 through 5 bilaterally.  Normal muscle strength.   Diagnosis: 1.  Tinea pedis bilaterally. 2.  Onychomycosis 1 through 5 bilaterally. 3.  Pain feet bilaterally  Plan: -New patient office visit for evaluation and management level 3. - Discussed some continued chronic tinea pedis and the earliest onychomycosis.  Recommended treating this for 90 days with the oral Lamisil along with the topical ketoconazole cream feet bilaterally.  I discussed preventative things he can do as far as socks and shoes.  Discontinue the Lotrisone for right now -Labs: LFTs -Rx Lamisil 250 mg 1 p.o. daily, refill x 1 - Rx ketoconazole cream apply 1 g to each foot twice daily, 6 refills  Return 6 weeks Lamisil 3

## 2024-10-20 ENCOUNTER — Ambulatory Visit: Admitting: Podiatry

## 2024-10-20 ENCOUNTER — Encounter: Payer: Self-pay | Admitting: Podiatry

## 2024-10-20 DIAGNOSIS — M79671 Pain in right foot: Secondary | ICD-10-CM | POA: Diagnosis not present

## 2024-10-20 DIAGNOSIS — B353 Tinea pedis: Secondary | ICD-10-CM

## 2024-10-20 DIAGNOSIS — B351 Tinea unguium: Secondary | ICD-10-CM

## 2024-10-20 DIAGNOSIS — M79672 Pain in left foot: Secondary | ICD-10-CM | POA: Diagnosis not present

## 2024-10-20 NOTE — Progress Notes (Signed)
" ° °  Subjective:    HPI Presents for follow-up onychomycosis treatment with p.o. Lamisil .  No problems taking medicine with no side effects noted.  Tenderness around toes with walking and wearing shoes.  Tinea pedis improved with decrease in rash.  No complaint of itching.  Using the ketoconazole  cream.   Objective:  Physical Exam   General: AAO x3, NAD  Vascular: DP and PT pulses palpable bilaterally.  Immedate capillary fill time digits. No significant lower extremity edema bilaterally.  Dermatological: Onychomycotic mycotic changes nails 1 through 5 with discoloration nail and subungual debris and thickening of the nail. 0% Clearance of onychomycotic nail changes noted. Tenderness with walking and wearing shoes.  Neruologic: Grossly intact B/L  Musculoskeletal:   Assessment:  Painful onychomycotic nails 1 through 5 bilaterally. Pain feet b/l 3.  Tinea pedis bilaterally    Plan:  -Established office visit level 3 for evaluation and management -Patient is tolerating Lamisil  treatment for onychomycotic nails well.  No side effects noted. Will continue this treatment. -Rx: Lamisil  250 mg p.o. daily - Labs ordered today for liver function tests to monitor for any hepatic side effects from the Lamisil .  - Return in 6 weeks for  Lamisil  final  "

## 2024-10-27 ENCOUNTER — Ambulatory Visit: Admitting: Podiatry

## 2024-11-25 ENCOUNTER — Ambulatory Visit (INDEPENDENT_AMBULATORY_CARE_PROVIDER_SITE_OTHER): Payer: Self-pay | Admitting: Primary Care

## 2024-11-25 VITALS — BP 117/81 | HR 94 | Temp 98.4°F | Resp 16 | Ht 67.0 in | Wt 180.8 lb

## 2024-11-25 DIAGNOSIS — Z113 Encounter for screening for infections with a predominantly sexual mode of transmission: Secondary | ICD-10-CM

## 2024-11-25 DIAGNOSIS — Z0000 Encounter for general adult medical examination without abnormal findings: Secondary | ICD-10-CM

## 2024-11-25 DIAGNOSIS — R0683 Snoring: Secondary | ICD-10-CM

## 2024-11-25 DIAGNOSIS — Z1211 Encounter for screening for malignant neoplasm of colon: Secondary | ICD-10-CM

## 2024-11-25 DIAGNOSIS — R7303 Prediabetes: Secondary | ICD-10-CM

## 2024-11-25 DIAGNOSIS — I10 Essential (primary) hypertension: Secondary | ICD-10-CM

## 2024-11-25 DIAGNOSIS — Z13220 Encounter for screening for lipoid disorders: Secondary | ICD-10-CM

## 2024-11-25 NOTE — Progress Notes (Unsigned)
 "  Complete physical exam  Patient: Charles Shaffer   DOB: 1976-05-10   49 y.o. Male  MRN: [id number]  Subjective:    Chief Complaint  Patient presents with   Annual Exam    Charles Shaffer is a 49 y.o. male who presents today for a complete physical exam. He reports consuming a general diet. Home exercise routine includes walking 150 min per week. He generally feels well. He reports sleeping well. He does not have additional problems to discuss today.    Most recent fall risk assessment:    11/25/2024    9:58 AM  Fall Risk   Falls in the past year? 0  Number falls in past yr: 0  Injury with Fall? 0  Risk for fall due to : No Fall Risks  Follow up Falls prevention discussed     Most recent depression screenings:    11/25/2024    9:58 AM 08/05/2024    7:52 AM  PHQ 2/9 Scores  PHQ - 2 Score 0   PHQ- 9 Score       Information is confidential and restricted. Go to Review Flowsheets to unlock data.    Vision:Within last year and Dental: No current dental problems  Patient Active Problem List   Diagnosis Date Noted   Sleep disorder, shift work 12/04/2023   Excessive daytime sleepiness 12/04/2023   Snoring 12/04/2023   Class 1 obesity due to excess calories with serious comorbidity and body mass index (BMI) of 33.0 to 33.9 in adult 12/04/2023   Prediabetes 12/04/2023   Numbness 02/05/2016   Arm numbness 02/05/2016   Past Medical History:  Diagnosis Date   High cholesterol    Hypertension    No past surgical history on file.    Patient Care Team: Celestia Rosaline SQUIBB, NP as PCP - General (Internal Medicine)   Show/hide medication list[1]  Review of Systems  All other systems reviewed and are negative.         Objective:   BP 117/81 (BP Location: Left Arm, Patient Position: Sitting, Cuff Size: Large)   Pulse 94   Temp 98.4 F (36.9 C)   Resp 16   Ht 5' 7 (1.702 m)   Wt 180 lb 12.8 oz (82 kg)   SpO2 98%   BMI 28.32 kg/m  BP Readings from Last 3 Encounters:   11/25/24 117/81  09/10/24 (!) 157/90  06/18/24 (!) 129/91       No results found for any visits on 11/25/24.     Assessment & Plan:    Routine Health Maintenance and Physical Exam  Immunization History  Administered Date(s) Administered   Tdap 09/05/2021    Health Maintenance  Topic Date Due   Hepatitis B Vaccines 19-59 Average Risk (1 of 3 - 19+ 3-dose series) Never done   Colonoscopy  Never done   COVID-19 Vaccine (1 - 2025-26 season) Never done   Influenza Vaccine  01/26/2025 (Originally 05/29/2024)   DTaP/Tdap/Td (2 - Td or Tdap) 09/06/2031   HPV VACCINES (No Doses Required) Completed   Hepatitis C Screening  Completed   HIV Screening  Completed   Pneumococcal Vaccine  Aged Out   Meningococcal B Vaccine  Aged Out    Discussed health benefits of physical activity, and encouraged him to engage in regular exercise appropriate for his age and condition. Charles Shaffer was seen today for annual exam.  Diagnoses and all orders for this visit:  Annual physical exam completed      Rosaline SQUIBB  Anysha Frappier, NP      [1]  Outpatient Medications Prior to Visit  Medication Sig   clotrimazole -betamethasone  (LOTRISONE ) cream Apply 1 Application topically daily.   ketoconazole  (NIZORAL ) 2 % cream ketoconazole  cream apply 1 g to each foot twice daily   No facility-administered medications prior to visit.   "

## 2024-12-01 ENCOUNTER — Ambulatory Visit: Admitting: Podiatry
